# Patient Record
Sex: Female | Born: 1955 | Race: Black or African American | Hispanic: No | Marital: Married | Smoking: Never smoker
Health system: Southern US, Community
[De-identification: ages and names within clinical notes are randomized; demographics above are authoritative.]

## PROBLEM LIST (undated history)

## (undated) DIAGNOSIS — Z87442 Personal history of urinary calculi: Secondary | ICD-10-CM

## (undated) DIAGNOSIS — K5900 Constipation, unspecified: Secondary | ICD-10-CM

## (undated) DIAGNOSIS — R011 Cardiac murmur, unspecified: Secondary | ICD-10-CM

## (undated) DIAGNOSIS — J45909 Unspecified asthma, uncomplicated: Secondary | ICD-10-CM

## (undated) DIAGNOSIS — F329 Major depressive disorder, single episode, unspecified: Secondary | ICD-10-CM

## (undated) DIAGNOSIS — R519 Headache, unspecified: Secondary | ICD-10-CM

## (undated) DIAGNOSIS — K219 Gastro-esophageal reflux disease without esophagitis: Secondary | ICD-10-CM

## (undated) DIAGNOSIS — K589 Irritable bowel syndrome without diarrhea: Secondary | ICD-10-CM

## (undated) DIAGNOSIS — M1990 Unspecified osteoarthritis, unspecified site: Secondary | ICD-10-CM

## (undated) DIAGNOSIS — R7303 Prediabetes: Secondary | ICD-10-CM

## (undated) DIAGNOSIS — G4730 Sleep apnea, unspecified: Secondary | ICD-10-CM

## (undated) DIAGNOSIS — F32A Depression, unspecified: Secondary | ICD-10-CM

## (undated) DIAGNOSIS — Z8719 Personal history of other diseases of the digestive system: Secondary | ICD-10-CM

## (undated) DIAGNOSIS — D649 Anemia, unspecified: Secondary | ICD-10-CM

## (undated) DIAGNOSIS — I10 Essential (primary) hypertension: Secondary | ICD-10-CM

## (undated) DIAGNOSIS — G935 Compression of brain: Secondary | ICD-10-CM

## (undated) DIAGNOSIS — J189 Pneumonia, unspecified organism: Secondary | ICD-10-CM

## (undated) DIAGNOSIS — R32 Unspecified urinary incontinence: Secondary | ICD-10-CM

## (undated) DIAGNOSIS — R51 Headache: Secondary | ICD-10-CM

## (undated) DIAGNOSIS — G5600 Carpal tunnel syndrome, unspecified upper limb: Secondary | ICD-10-CM

## (undated) DIAGNOSIS — I639 Cerebral infarction, unspecified: Secondary | ICD-10-CM

## (undated) DIAGNOSIS — N189 Chronic kidney disease, unspecified: Secondary | ICD-10-CM

## (undated) DIAGNOSIS — C801 Malignant (primary) neoplasm, unspecified: Secondary | ICD-10-CM

## (undated) DIAGNOSIS — F419 Anxiety disorder, unspecified: Secondary | ICD-10-CM

## (undated) HISTORY — PX: BREAST SURGERY: SHX581

## (undated) HISTORY — DX: Sleep apnea, unspecified: G47.30

## (undated) HISTORY — PX: APPENDECTOMY: SHX54

## (undated) HISTORY — PX: ABDOMINAL HYSTERECTOMY: SHX81

## (undated) HISTORY — PX: CARPAL TUNNEL RELEASE: SHX101

## (undated) NOTE — *Deleted (*Deleted)
[facility]  [address] [place],  [place]  [address] [phone]  Clinic Day:  05/18/2020  Referring physician: No ref. provider found   This document serves as a record of services personally performed by Gery Pray, MD. It was created on their behalf by Curry,Lauren E, a trained medical scribe. The creation of this record is based on the scribe's personal observations and the provider's statements to them.   CHIEF COMPLAINT:  CC: ***  Current Treatment:  ***   HISTORY OF PRESENT ILLNESS:  April Pham is a 47 y.o. female with anemia, elevated alkaline phosphatase, history of a left thyroid nodule and significant weight loss.  Labs from February 3rd revealed an elevated alkaline phosphatase of the bone at 155. Whole body bone scan from April 20th was negative for malignancy, but showed significant arthritis.  Alkaline phosphatase of the liver was normal.  She presented to the Emergency Department in late March due to a fall where she hit her head and left side.  CT imaging of head, face, and cervical spine was pursued which was negative for head or facial injury.  However, it revealed a 2.4 cm hypodense left thyroid nodule, which appears slightly larger when compared to previous imaging results.  She underwent an ultrasound followed by a biopsy of this nodule on April 27th which confirmed this to be benign, consistent with a follicular nodule.   She also has a history of stage II right breast cancer in 2013, treated with lumpectomy and radiation.  She was placed on hormonal therapy with tamoxifen which was changed to letrozole after she had a mild stroke.  She stopped letrozole in December 2020, and in total she was on hormonal therapy for 7 years.  Annual mammogram from February 2021 was clear.  She reports intermittent severe fatigue to the point where she sometimes cannot even get out of bed and has difficulty keeping up with her daily  activities.  She continues to work, but has had to cut back on her hours.  Since her fall, she has been experiencing severe headaches and shooting pains of her legs.  She also sprained her left wrist at that time, and so she is wearing a brace.  She has chronic nausea.  She has intermittent hot and cold flashes, but since discontinuing letrozole these have improved.  She has night sweats.  She denies any forms of overt bleeding.  She had constant diarrhea partly due to Linzess.  She is scheduled for GI follow up on May 21st.  She undergoes esophageal dilation every year.  Her blood counts and chemistries are all unremarkable.  Her appetite is good, and she is eating very well.  However, she has lost over 40 pounds over the last 6 months.  She denies fever or chills.  She denies vomiting, bowel issues, or abdominal pain.  She denies sore throat, cough, dyspnea, or chest pain.  She started menarche at age 87.  She has had 2 pregnancies with 1 live birth and 1 abortion.  She had her 1st child at age 38.  She was on birth control previously to regulate her menstrual cycles.  She underwent a total hysterectomy and bilateral salpingo oophorectomy at age 3 due to fibroids and continued menses.      INTERVAL HISTORY:  April Pham is here for routine follow up   Her  appetite is good, and she has gained/lost _ pounds since her last visit.  She denies fever, chills  or other signs of infection.  She denies nausea, vomiting, bowel issues, or abdominal pain.  She denies sore throat, cough, dyspnea, or chest pain.   REVIEW OF SYSTEMS:  Review of Systems - Oncology   VITALS:  There were no vitals taken for this visit.  Wt Readings from Last 3 Encounters:  09/19/18 209 lb 11.2 oz (95.1 kg)  07/14/16 208 lb 9.6 oz (94.6 kg)  05/24/16 194 lb 10.7 oz (88.3 kg)    There is no height or weight on file to calculate BMI.  Performance status (ECOG): {CHL ONC [id number]  PHYSICAL EXAM:  Physical Exam  LABS:   CBC  Latest Ref Rng & Units 09/19/2018 07/13/2016 06/12/2016  WBC 4.0 - 10.5 K/uL 5.6 3.7(L) 4.6  Hemoglobin 12.0 - 15.0 g/dL 16.1 0.9(U) 11.6(L)  Hematocrit 36 - 46 % 40.2 29.6(L) 35.3  Platelets 150 - 400 K/uL 261 PLATELET CLUMPS NOTED ON SMEAR, COUNT APPEARS DECREASED 204   CMP Latest Ref Rng & Units 09/19/2018 07/13/2016 06/13/2016  Glucose 70 - 99 mg/dL 92 86 97  BUN 8 - 23 mg/dL 9 14 04(V)  Creatinine 0.44 - 1.00 mg/dL 4.09(W) 1.19(J) 4.78(G)  Sodium 135 - 145 mmol/L 140 137 138  Potassium 3.5 - 5.1 mmol/L 3.9 4.0 4.5  Chloride 98 - 111 mmol/L 110 105 106  CO2 22 - 32 mmol/L 22 21(L) 25  Calcium 8.9 - 10.3 mg/dL 9.8 9.8 9.2  Total Protein 6.5 - 8.1 g/dL - - -  Total Bilirubin 0.3 - 1.2 mg/dL - - -  Alkaline Phos 38 - 126 U/L - - -  AST 15 - 41 U/L - - -  ALT 14 - 54 U/L - - -     No results found for: CEA1 / No results found for: CEA1 No results found for: PSA1 No results found for: NFA213 No results found for: YQM578  No results found for: TOTALPROTELP, ALBUMINELP, A1GS, A2GS, BETS, BETA2SER, GAMS, MSPIKE, SPEI No results found for: TIBC, FERRITIN, IRONPCTSAT No results found for: LDH   STUDIES:  No results found.   Allergies:  Allergies  Allergen Reactions  . Ibuprofen Other (See Comments)    REFLUX  . Nsaids Other (See Comments)    REFLUX [IBUPROFEN]    Current Medications: Current Outpatient Medications  Medication Sig Dispense Refill  . acetaminophen (TYLENOL) 500 MG tablet Take 1,000 mg by mouth every 6 (six) hours as needed for headache.    Marland Kitchen amitriptyline (ELAVIL) 50 MG tablet Take 1 tablet (50 mg total) by mouth at bedtime. 30 tablet 0  . atorvastatin (LIPITOR) 10 MG tablet Take 1 tablet (10 mg total) by mouth daily at 6 PM. (Patient not taking: Reported on 09/16/2018) 30 tablet 0  . atorvastatin (LIPITOR) 20 MG tablet Take 20 mg by mouth at bedtime.    . B Complex-C-Folic Acid (SUPER B COMPLEX/FA/VIT C PO) Take 1 tablet by mouth daily.    . Calcium  Carb-Cholecalciferol (CALCIUM 600/VITAMIN D3 PO) Take 2 tablets by mouth daily.    . cetirizine (ZYRTEC) 10 MG tablet Take 10 mg by mouth daily.     . ferrous sulfate 325 (65 FE) MG EC tablet Take 325 mg by mouth daily.     . fluticasone (FLOVENT HFA) 110 MCG/ACT inhaler Inhale 1 puff into the lungs 2 (two) times daily.    Marland Kitchen HYDROcodone-acetaminophen (NORCO/VICODIN) 5-325 MG tablet Take 1 tablet by mouth every 4 (four) hours as needed for moderate pain. (Patient not taking: Reported  on 09/16/2018) 30 tablet 0  . letrozole (FEMARA) 2.5 MG tablet Take 2.5 mg by mouth daily.     Marland Kitchen linaclotide (LINZESS) 72 MCG capsule Take 72 mcg by mouth daily before breakfast.    . lisinopril-hydrochlorothiazide (PRINZIDE,ZESTORETIC) 10-12.5 MG tablet Take 1 tablet by mouth daily.  1  . omeprazole (PRILOSEC) 20 MG capsule Take 40 mg by mouth daily.     . ondansetron (ZOFRAN ODT) 4 MG disintegrating tablet Take 1 tablet (4 mg total) by mouth every 8 (eight) hours as needed for nausea or vomiting. 20 tablet 0  . QUEtiapine (SEROQUEL) 100 MG tablet Take 100 mg by mouth at bedtime.   5  . venlafaxine XR (EFFEXOR-XR) 75 MG 24 hr capsule Take 75 mg by mouth daily with breakfast.     No current facility-administered medications for this visit.     ASSESSMENT & PLAN:   Assessment:  April Pham is a 64 y.o. female *** 1.  History of breast cancer of the right breast, treated with lumpectomy and radiation therapy.  She was then placed on hormonal therapy with tamoxifen, but this was later changed to letrozole following a TIA.  She discontinued letrozole back in December 2020, after completing a total of 7 years of hormonal therapy.  She remains without evidence of recurrence.  2.  Left thyroid nodule now measuring 2.4 cm, which appears slightly larger when compared to previous imaging results.  Biopsy has confirmed this to be benign, consistent with follicular nodule.  3.  Significant weight loss within the  past 6 months despite regular meals and good appetite.  She has lost over 40 pounds within the last 6 months.  We will pursue CT imaging for further evaluation, especially in light of her upper abdominal pain and nausea.    4.  Elevated alkaline phosphatase.  Bone scan was negative, and the alkaline phosphatase is now normal and so I do not think further evaluation is needed.  5.  Anemia.  Apparently it has been borderline and up and down, but is normal today.  She is normochromic and normocytic.  I don't feel there is any pathology to pursue.  Plan: She is not anemic at this time, and her alkaline phosphatase is in a normal range.  Whole body bone scan was negative for malignancy, but did reveal significant arthritis.  I advised that we pursue CT imaging of chest, abdomen and pelvis as she has had a significant weight loss despite regular eating.  She also has upper abdominal pain and tenderness as well as nausea and a history of cancer.  Also, as she was on hormonal therapy for 7 years, I do recommend that she undergo a bone density scan.  I will plan to schedule these for her and call her with the results.  She is no longer seeing her previous oncologist, and so I will take over her oncology follow up care.  If all is well, I will see her back in 6 months for reexamination.  She and her husband understand and agree to this plan of care.  I have answered her questions and she knows to call with any concerns.   I provided *** minutes (9:28 AM - 9:28 AM) of face-to-face time during this this encounter and > 50% was spent counseling as documented under my assessment and plan.    Dellia Beckwith, MD [facility] [address] [place] [address] Dept: [phone]  Dept Fax: [phone]   I, Foye Deer, am acting as scribe for Dellia Beckwith, MD  I have reviewed this report as typed by the medical scribe, and it is  complete and accurate.

---

## 2016-05-24 ENCOUNTER — Inpatient Hospital Stay (HOSPITAL_COMMUNITY): Payer: Medicaid Other

## 2016-05-24 ENCOUNTER — Encounter (HOSPITAL_COMMUNITY): Payer: Self-pay | Admitting: *Deleted

## 2016-05-24 ENCOUNTER — Inpatient Hospital Stay (HOSPITAL_COMMUNITY)
Admission: EM | Admit: 2016-05-24 | Discharge: 2016-05-25 | DRG: 103 | Disposition: A | Discharge status: Home / Self Care | Payer: Medicaid Other | Source: Other Acute Inpatient Hospital | Attending: Internal Medicine | Admitting: Internal Medicine

## 2016-05-24 DIAGNOSIS — Z8249 Family history of ischemic heart disease and other diseases of the circulatory system: Secondary | ICD-10-CM

## 2016-05-24 DIAGNOSIS — F329 Major depressive disorder, single episode, unspecified: Secondary | ICD-10-CM | POA: Diagnosis present

## 2016-05-24 DIAGNOSIS — K219 Gastro-esophageal reflux disease without esophagitis: Secondary | ICD-10-CM | POA: Diagnosis present

## 2016-05-24 DIAGNOSIS — I129 Hypertensive chronic kidney disease with stage 1 through stage 4 chronic kidney disease, or unspecified chronic kidney disease: Secondary | ICD-10-CM | POA: Diagnosis present

## 2016-05-24 DIAGNOSIS — R040 Epistaxis: Secondary | ICD-10-CM | POA: Diagnosis present

## 2016-05-24 DIAGNOSIS — G44209 Tension-type headache, unspecified, not intractable: Secondary | ICD-10-CM | POA: Diagnosis present

## 2016-05-24 DIAGNOSIS — Q0700 Arnold-Chiari syndrome without spina bifida or hydrocephalus: Secondary | ICD-10-CM | POA: Diagnosis not present

## 2016-05-24 DIAGNOSIS — N183 Chronic kidney disease, stage 3 (moderate): Secondary | ICD-10-CM | POA: Diagnosis present

## 2016-05-24 DIAGNOSIS — R51 Headache: Secondary | ICD-10-CM

## 2016-05-24 DIAGNOSIS — Z8673 Personal history of transient ischemic attack (TIA), and cerebral infarction without residual deficits: Secondary | ICD-10-CM

## 2016-05-24 DIAGNOSIS — Z79811 Long term (current) use of aromatase inhibitors: Secondary | ICD-10-CM

## 2016-05-24 DIAGNOSIS — Z853 Personal history of malignant neoplasm of breast: Secondary | ICD-10-CM

## 2016-05-24 DIAGNOSIS — I10 Essential (primary) hypertension: Secondary | ICD-10-CM | POA: Diagnosis not present

## 2016-05-24 DIAGNOSIS — Z79899 Other long term (current) drug therapy: Secondary | ICD-10-CM

## 2016-05-24 DIAGNOSIS — R519 Headache, unspecified: Secondary | ICD-10-CM

## 2016-05-24 DIAGNOSIS — D649 Anemia, unspecified: Secondary | ICD-10-CM | POA: Diagnosis present

## 2016-05-24 HISTORY — DX: Essential (primary) hypertension: I10

## 2016-05-24 HISTORY — DX: Depression, unspecified: F32.A

## 2016-05-24 HISTORY — DX: Malignant (primary) neoplasm, unspecified: C80.1

## 2016-05-24 HISTORY — DX: Major depressive disorder, single episode, unspecified: F32.9

## 2016-05-24 LAB — CBC WITH DIFFERENTIAL/PLATELET
BASOS ABS: 0 10*3/uL (ref 0.0–0.1)
BASOS PCT: 0 %
Eosinophils Absolute: 0.3 10*3/uL (ref 0.0–0.7)
Eosinophils Relative: 6 %
HEMATOCRIT: 33.2 % — AB (ref 36.0–46.0)
HEMOGLOBIN: 10.5 g/dL — AB (ref 12.0–15.0)
Lymphocytes Relative: 53 %
Lymphs Abs: 2.3 10*3/uL (ref 0.7–4.0)
MCH: 26.7 pg (ref 26.0–34.0)
MCHC: 31.6 g/dL (ref 30.0–36.0)
MCV: 84.5 fL (ref 78.0–100.0)
Monocytes Absolute: 0.4 10*3/uL (ref 0.1–1.0)
Monocytes Relative: 9 %
NEUTROS ABS: 1.4 10*3/uL — AB (ref 1.7–7.7)
NEUTROS PCT: 32 %
Platelets: 214 10*3/uL (ref 150–400)
RBC: 3.93 MIL/uL (ref 3.87–5.11)
RDW: 13.1 % (ref 11.5–15.5)
WBC: 4.3 10*3/uL (ref 4.0–10.5)

## 2016-05-24 LAB — COMPREHENSIVE METABOLIC PANEL
ALK PHOS: 124 U/L (ref 38–126)
ALT: 15 U/L (ref 14–54)
ANION GAP: 8 (ref 5–15)
AST: 18 U/L (ref 15–41)
Albumin: 3.4 g/dL — ABNORMAL LOW (ref 3.5–5.0)
BILIRUBIN TOTAL: 0.4 mg/dL (ref 0.3–1.2)
BUN: 12 mg/dL (ref 6–20)
CALCIUM: 9.8 mg/dL (ref 8.9–10.3)
CO2: 27 mmol/L (ref 22–32)
Chloride: 104 mmol/L (ref 101–111)
Creatinine, Ser: 1.25 mg/dL — ABNORMAL HIGH (ref 0.44–1.00)
GFR calc non Af Amer: 45 mL/min — ABNORMAL LOW (ref >60)
GFR, EST AFRICAN AMERICAN: 53 mL/min — AB (ref >60)
Glucose, Bld: 95 mg/dL (ref 65–99)
Potassium: 3.9 mmol/L (ref 3.5–5.1)
Sodium: 139 mmol/L (ref 135–145)
TOTAL PROTEIN: 6.4 g/dL — AB (ref 6.5–8.1)

## 2016-05-24 LAB — SEDIMENTATION RATE: Sed Rate: 25 mm/hr — ABNORMAL HIGH (ref 0–22)

## 2016-05-24 LAB — RAPID URINE DRUG SCREEN, HOSP PERFORMED
Amphetamines: NOT DETECTED
BARBITURATES: NOT DETECTED
BENZODIAZEPINES: NOT DETECTED
COCAINE: NOT DETECTED
Opiates: POSITIVE — AB
TETRAHYDROCANNABINOL: NOT DETECTED

## 2016-05-24 MED ORDER — ENSURE ENLIVE PO LIQD
237.0000 mL | ORAL | Status: DC
  Administered 2016-05-24: 237 mL via ORAL

## 2016-05-24 MED ORDER — HYDRALAZINE HCL 20 MG/ML IJ SOLN: 10.0000 mg | INTRAMUSCULAR | Status: DC | PRN

## 2016-05-24 MED ORDER — LISINOPRIL 10 MG PO TABS
10.0000 mg | ORAL_TABLET | Freq: Every day | ORAL | Status: DC
  Administered 2016-05-24: 10 mg via ORAL
  Filled 2016-05-24 (×2): qty 1

## 2016-05-24 MED ORDER — LUBIPROSTONE 24 MCG PO CAPS
24.0000 ug | ORAL_CAPSULE | Freq: Two times a day (BID) | ORAL | Status: DC
Start: 2016-05-24 — End: 2016-05-25
  Administered 2016-05-24 – 2016-05-25 (×2): 24 ug via ORAL
  Filled 2016-05-24 (×2): qty 1

## 2016-05-24 MED ORDER — MORPHINE SULFATE (PF) 2 MG/ML IV SOLN
2.0000 mg | INTRAVENOUS | Status: DC | PRN
  Administered 2016-05-24 – 2016-05-25 (×6): 2 mg via INTRAVENOUS
  Filled 2016-05-24 (×7): qty 1

## 2016-05-24 MED ORDER — ONDANSETRON HCL 4 MG/2ML IJ SOLN: 4.0000 mg | Freq: Four times a day (QID) | INTRAMUSCULAR | Status: DC | PRN

## 2016-05-24 MED ORDER — LETROZOLE 2.5 MG PO TABS
2.5000 mg | ORAL_TABLET | Freq: Every day | ORAL | Status: DC
  Administered 2016-05-25: 2.5 mg via ORAL
  Filled 2016-05-24 (×2): qty 1

## 2016-05-24 MED ORDER — FERROUS SULFATE 325 (65 FE) MG PO TABS
325.0000 mg | ORAL_TABLET | Freq: Every day | ORAL | Status: DC
  Administered 2016-05-24 – 2016-05-25 (×2): 325 mg via ORAL
  Filled 2016-05-24 (×2): qty 1

## 2016-05-24 MED ORDER — ACETAMINOPHEN 325 MG PO TABS
650.0000 mg | ORAL_TABLET | Freq: Four times a day (QID) | ORAL | Status: DC | PRN
  Administered 2016-05-24: 650 mg via ORAL
  Filled 2016-05-24: qty 2

## 2016-05-24 MED ORDER — SODIUM CHLORIDE 0.9 % IV SOLN
INTRAVENOUS | Status: AC
  Administered 2016-05-24 (×2): via INTRAVENOUS

## 2016-05-24 MED ORDER — ONDANSETRON HCL 4 MG PO TABS: 4.0000 mg | ORAL_TABLET | Freq: Four times a day (QID) | ORAL | Status: DC | PRN

## 2016-05-24 MED ORDER — ACETAMINOPHEN 650 MG RE SUPP: 650.0000 mg | Freq: Four times a day (QID) | RECTAL | Status: DC | PRN

## 2016-05-24 MED ORDER — QUETIAPINE FUMARATE 25 MG PO TABS
200.0000 mg | ORAL_TABLET | Freq: Every day | ORAL | Status: DC
  Administered 2016-05-24: 200 mg via ORAL
  Filled 2016-05-24: qty 8

## 2016-05-24 MED ORDER — FLUOXETINE HCL 20 MG PO CAPS
40.0000 mg | ORAL_CAPSULE | Freq: Every day | ORAL | Status: DC
  Administered 2016-05-24 – 2016-05-25 (×2): 40 mg via ORAL
  Filled 2016-05-24 (×2): qty 2

## 2016-05-24 NOTE — Progress Notes (Signed)
This is a no charge note  Transfer from [facility] per Dr. Maxwell Caul  60 year old lady with past medical history of hypertension, stroke, GERD, depression, iron deficiency anemia, who presents with headache, blurry vision and right arm tingling, which has been going on for 2 days. CT head showed Chiari-I malformation. Pt's CT-scan of head on 12/2013 did no have show this finding per EDP. Neurology, dr. Cheral Marker was consulted, who recommended neurosurgeons consultation and evaluation.   WBC 4.7, creatinine 1.4 which was 1.0 on 03/2016 and 2.8 on 12/2015, temperature normal, blood pressure 146/71, O2 saturation 98% on room air. Patient is accepted to tele bed as inpatient. Please consult to neurosurgeon at patient arrival. Pt may also need MRI of brain.   Ivor Costa, MD  [facility] Pager [phone]  If 7PM-7AM, please contact night-coverage [email or web address] Password [email or web address] 05/24/2016, 2:42 AM

## 2016-05-24 NOTE — H&P (Addendum)
History and Physical    April Pham [id number] DOB: 1956/01/27 DOA: 05/24/2016  PCP: No PCP Per Patient  Patient coming from: [facility].  Chief Complaint: Headache.  HPI: April Pham is a 60 y.o. female with history of hypertension, breast cancer in remission, depression and iron deficiency anemia presents to the ER at [facility] with severe headache. Patient states she has been having headaches which starts from the occipital area and goes across the front of the head with some nausea and blurred vision over the last 2 weeks which has been progressively worsening. Had some tingling of the right upper extremity. Denies any weakness of the upper or lower extremity. Patient also had some epistaxis. 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 Patient was given morphine for the headache. On call neurologist at [facility] was consulted and transferred to [facility] for further management and possible need for neurosurgery consult.   ED Course: Morphine was given in the ER at [facility] for pain relief. Labs done at [facility] showed WBC of 4.7 hemoglobin of 11 platelets of 212 sodium of 138 potassium 4 creatinine 1.4  Review of Systems: As per HPI, rest all negative.   Past Medical History:  Diagnosis Date  . Cancer ([place])   . Depression   . Hypertension     Past Surgical History:  Procedure Laterality Date  . ABDOMINAL HYSTERECTOMY    . APPENDECTOMY    . BREAST SURGERY       reports that she has never smoked. She has never used smokeless tobacco. She reports that she does not drink alcohol or use drugs.  Not on File  Family History  Problem Relation Age of Onset  . Hypertension Daughter     Prior to Admission medications   Not on File    Physical Exam: Vitals:   05/24/16 0442  BP: 125/69  Pulse: 66  Resp: 18  Temp: 99 F (37.2 C)  TempSrc: Oral  SpO2: 97%  Weight: 88.3 kg (194 lb 10.7 oz)    Height: 5\' 6"  (1.676 m)      Constitutional: Moderately built and nourished. Vitals:   05/24/16 0442  BP: 125/69  Pulse: 66  Resp: 18  Temp: 99 F (37.2 C)  TempSrc: Oral  SpO2: 97%  Weight: 88.3 kg (194 lb 10.7 oz)  Height: 5\' 6"  (1.676 m)   Eyes: Anicteric no pallor. ENMT: No discharge from the ears eyes nose or mouth. Neck: No mass felt. No neck rigidity. Respiratory: No rhonchi or crepitations. Cardiovascular: S1 and S2 heard. No murmurs appreciated. Abdomen: Soft nontender bowel sounds present. No guarding or rigidity. Musculoskeletal: No edema. No joint effusion. Skin: No rash. Skin appears warm. Neurologic: Alert awake oriented to time place and person. Moves all extremities 5 x 5. No facial asymmetry. Tongue is midline. Psychiatric: Appears normal. Normal affect.   Labs on Admission: I have personally reviewed following labs and imaging studies  CBC: No results for input(s): WBC, NEUTROABS, HGB, HCT, MCV, PLT in the last 168 hours. Basic Metabolic Panel: No results for input(s): NA, K, CL, CO2, GLUCOSE, BUN, CREATININE, CALCIUM, MG, PHOS in the last 168 hours. GFR: CrCl cannot be calculated (No order found.). Liver Function Tests: No results for input(s): AST, ALT, ALKPHOS, BILITOT, PROT, ALBUMIN in the last 168 hours. No results for input(s): LIPASE, AMYLASE in the last 168 hours. No results for input(s): AMMONIA in the last 168 hours. Coagulation Profile: No results  for input(s): INR, PROTIME in the last 168 hours. Cardiac Enzymes: No results for input(s): CKTOTAL, CKMB, CKMBINDEX, TROPONINI in the last 168 hours. BNP (last 3 results) No results for input(s): PROBNP in the last 8760 hours. HbA1C: No results for input(s): HGBA1C in the last 72 hours. CBG: No results for input(s): GLUCAP in the last 168 hours. Lipid Profile: No results for input(s): CHOL, HDL, LDLCALC, TRIG, CHOLHDL, LDLDIRECT in the last 72 hours. Thyroid Function Tests: No results  for input(s): TSH, T4TOTAL, FREET4, T3FREE, THYROIDAB in the last 72 hours. Anemia Panel: No results for input(s): VITAMINB12, FOLATE, FERRITIN, TIBC, IRON, RETICCTPCT in the last 72 hours. Urine analysis: No results found for: COLORURINE, APPEARANCEUR, LABSPEC, PHURINE, GLUCOSEU, HGBUR, BILIRUBINUR, KETONESUR, PROTEINUR, UROBILINOGEN, NITRITE, LEUKOCYTESUR Sepsis Labs: @LABRCNTIP (procalcitonin:4,lacticidven:4) )No results found for this or any previous visit (from the past 240 hour(s)).   Radiological Exams on Admission: No results found.   Assessment/Plan Principal Problem:   Headache Active Problems:   Essential hypertension   Normochromic normocytic anemia   History of breast cancer    1. Headache with CT scan showing Arnold-Chiari malformation type I - Patient has been placed on when necessary morphine for pain relief. I have discussed with on-call neurosurgeon Dr. Ronnald Ramp who has advised MRI brain and to discuss with Dr. Ronnald Ramp, neurosurgeon after the MRI brain results are available. 2. Hypertension - we'll continue lisinopril and hold hydrochlorothiazide since patient is getting gentle hydration. Follow metabolic panel. When necessary IV hydralazine for systolic blood pressure more than 160. 3. Renal failure Baseline creatinine not known probably chronic - repeat metabolic panel has been ordered. Creatinine at [facility] is 1.4. If there is further worsening of creatinine and hold lisinopril. 4. Normocytic normochromic anemia on iron replacement - follow CBC. 5. History of breast cancer in remission on Femara. 6. Depression on sertraline and fluoxetine.  Home medication reconciliation is still pending. Repeat labs including CBC metabolic panel and sedimentation rate MRI brain pending.   DVT prophylaxis: SCDs. Code Status: Full code.  Family Communication: Discussed with patient.  Disposition Plan: Home.  Consults called: Neurosurgery Dr. Ronnald Ramp. Admission status:  Inpatient. Likely stay 2 days.    Rise Patience MD [facility] Pager [phone].  If 7PM-7AM, please contact night-coverage [email or web address] Password [facility]  05/24/2016, 8:39 AM

## 2016-05-24 NOTE — Progress Notes (Addendum)
MD, Hal Hope, called to check on patient. Told MD patients vitals and pain 10/10. Gave verbal orders to administer 2 mg morphine 4 mg IV PRN Q4. Will continue to monitor

## 2016-05-24 NOTE — Progress Notes (Signed)
Initial Nutrition Assessment  DOCUMENTATION CODES:   Obesity unspecified  INTERVENTION:   - Provide Ensure Enlive oral nutrition supplement once daily. Each provides 350 kcal and 20 grams protein. - Encourage PO intake.  NUTRITION DIAGNOSIS:   Inadequate oral intake related to poor appetite as evidenced by per patient/family report.  GOAL:   Patient will meet greater than or equal to 90% of their needs  MONITOR:   PO intake, Supplement acceptance, Weight trends  REASON FOR ASSESSMENT:   Malnutrition Screening Tool   ASSESSMENT:   60 y.o. female with history of hypertension, breast cancer in remission, depression and iron deficiency anemia presents to the ER with severe headache. Pt states she has been having headaches which start from the occipital area and goes across the front of the head with some nausea and blurred vision over the last 2 weeks which has been progressively worsening. Had some tingling of the right upper extremity. Pt also had some epistaxis. 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  Spoke with pt at bedside who reports appetite is "off and on" and that she typically consumes 1-2 meals per day. Pt states she cooks at home and that a typical dinner might include cabbage, green beans, and ham. Pt eating lunch meal at time of visit; states she is "very hungry" as she did not eat at all yesterday.  Pt reports her UBW is 199# and that she last weighed this 1 month PTA.  Pt states she has consumed oral supplements in the past especially when she did not feeling like eating a meal. Pt would like to receive Ensure Enlive oral nutrition supplement during hospital stay. Will order one daily.  Medications reviewed and include 325 mg ferrous sulfate, PRN Zofran  Labs reviewed and include elevated creatinine (1.25 mg/dL)  NFPE: Exam completed. No fat depletion, no muscle depletion, and no edema noted.  Diet Order:  Diet Heart Room service  appropriate? Yes; Fluid consistency: Thin  Skin:  Reviewed, no issues  Last BM:  05/22/16  Height:   Ht Readings from Last 1 Encounters:  05/24/16 5\' 6"  (1.676 m)    Weight:   Wt Readings from Last 1 Encounters:  05/24/16 194 lb 10.7 oz (88.3 kg)    Ideal Body Weight:  59.1 kg  BMI:  Body mass index is 31.42 kg/m.  Estimated Nutritional Needs:   Kcal:  1600-1800 (18-20 kcal/kg)  Protein:  85-100 grams  Fluid:  1.6-1.8 L/day  EDUCATION NEEDS:   No education needs identified at this time  Jeb Levering Dietetic Intern Pager Number: [phone]

## 2016-05-24 NOTE — Progress Notes (Signed)
Patient is experiencing pain 10/10. Dr. Hal Hope made aware. No new orders at this time

## 2016-05-24 NOTE — Progress Notes (Signed)
PROGRESS NOTE    April Pham  [id number] DOB: 02-Sep-1955 DOA: 05/24/2016 PCP: No PCP Per Patient   No chief complaint on file.   Brief Narrative:  HPI on 05/24/2016 by Dr. Gean Birchwood April Pham is a 60 y.o. female with history of hypertension, breast cancer in remission, depression and iron deficiency anemia presents to the ER at [facility] with severe headache. Patient states she has been having headaches which starts from the occipital area and goes across the front of the head with some nausea and blurred vision over the last 2 weeks which has been progressively worsening. Had some tingling of the right upper extremity. Denies any weakness of the upper or lower extremity. Patient also had some epistaxis. 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 Patient was given morphine for the headache. On call neurologist at [facility] was consulted and transferred to [facility] for further management and possible need for neurosurgery consult.  Assessment & Plan   Headache -Unknown etiology -CT head done at [facility] showed Arnold-Chiari malformation type I. Patient was sent to [facility] for further workup -MRI brain: Negative for acute infarct, scattered small white matter hyper intensities, cerebellar tonsillar ectopia -Continue pain control -Spoke with Dr. Ronnald Ramp, did not feel Chiari malformation is the cause of patient's headache  Arnold Chiari malformation, Type I -MRI as noted above -Spoke with Dr. Ronnald Ramp, neurosurgery, who recommended outpatient follow-up  Essential hypertension -Continue lisinopril, HCTZ held  Chronic kidney disease, unknown stage (possibly stage III) -Creatinine currently 1.25. Was noted to be 1.4 at [facility] to monitor BMP  Normocytic normochromic anemia -Continue iron replacement -Hemoglobin currently 10.5, unknown baseline -Continue to monitor CBC  History of breast  cancer -Continue Femara  Depression -Continue sertraline and fluoxetine  DVT Prophylaxis  SCDs  Code Status: Full  Family Communication: None at bedside  Disposition Plan: Admitted  Consultants Neurosurgeon, Dr. Ronnald Ramp, Via phone  Procedures  None  Antibiotics   Anti-infectives    None      Subjective:   April Pham seen and examined today. Continues to have headache. Denies chest pain, shortness of breath, abdominal pain, nausea or vomiting, diarrhea or constipation.  Objective:   Vitals:   05/24/16 0442 05/24/16 0921  BP: 125/69 (!) 120/59  Pulse: 66 69  Resp: 18   Temp: 99 F (37.2 C)   TempSrc: Oral   SpO2: 97% 99%  Weight: 88.3 kg (194 lb 10.7 oz)   Height: 5\' 6"  (1.676 m)     Intake/Output Summary (Last 24 hours) at 05/24/16 1400 Last data filed at 05/24/16 1005  Gross per 24 hour  Intake               60 ml  Output              300 ml  Net             -240 ml   Filed Weights   05/24/16 0442  Weight: 88.3 kg (194 lb 10.7 oz)    Exam  General: Well developed, well nourished, NAD, appears stated age  HEENT: NCAT,  mucous membranes moist.   Cardiovascular: S1 S2 auscultated, no rubs, murmurs or gallops. Regular rate and rhythm.  Respiratory: Clear to auscultation bilaterally with equal chest rise  Abdomen: Soft, nontender, nondistended, + bowel sounds  Extremities: warm dry without cyanosis clubbing or edema  Neuro: AAOx3, nonfocal  Psych: Normal affect and demeanor with intact judgement  and insight   Data Reviewed: I have personally reviewed following labs and imaging studies  CBC:  Recent Labs Lab 05/24/16 0854  WBC 4.3  NEUTROABS 1.4*  HGB 10.5*  HCT 33.2*  MCV 84.5  PLT [id number]   Basic Metabolic Panel:  Recent Labs Lab 05/24/16 0854  NA 139  K 3.9  CL 104  CO2 27  GLUCOSE 95  BUN 12  CREATININE 1.25*  CALCIUM 9.8   GFR: Estimated Creatinine Clearance: 52.9 mL/min (by C-G formula based on SCr of 1.25 mg/dL  (H)). Liver Function Tests:  Recent Labs Lab 05/24/16 0854  AST 18  ALT 15  ALKPHOS 124  BILITOT 0.4  PROT 6.4*  ALBUMIN 3.4*   No results for input(s): LIPASE, AMYLASE in the last 168 hours. No results for input(s): AMMONIA in the last 168 hours. Coagulation Profile: No results for input(s): INR, PROTIME in the last 168 hours. Cardiac Enzymes: No results for input(s): CKTOTAL, CKMB, CKMBINDEX, TROPONINI in the last 168 hours. BNP (last 3 results) No results for input(s): PROBNP in the last 8760 hours. HbA1C: No results for input(s): HGBA1C in the last 72 hours. CBG: No results for input(s): GLUCAP in the last 168 hours. Lipid Profile: No results for input(s): CHOL, HDL, LDLCALC, TRIG, CHOLHDL, LDLDIRECT in the last 72 hours. Thyroid Function Tests: No results for input(s): TSH, T4TOTAL, FREET4, T3FREE, THYROIDAB in the last 72 hours. Anemia Panel: No results for input(s): VITAMINB12, FOLATE, FERRITIN, TIBC, IRON, RETICCTPCT in the last 72 hours. Urine analysis: No results found for: COLORURINE, APPEARANCEUR, LABSPEC, PHURINE, GLUCOSEU, HGBUR, BILIRUBINUR, KETONESUR, PROTEINUR, UROBILINOGEN, NITRITE, LEUKOCYTESUR Sepsis Labs: @LABRCNTIP (procalcitonin:4,lacticidven:4)  )No results found for this or any previous visit (from the past 240 hour(s)).    Radiology Studies: Mr Brain Wo Contrast  Result Date: 05/24/2016 CLINICAL DATA:  Headache EXAM: MRI HEAD WITHOUT CONTRAST TECHNIQUE: Multiplanar, multiecho pulse sequences of the brain and surrounding structures were obtained without intravenous contrast. COMPARISON:  None. FINDINGS: Brain: Ventricle size normal. Cerebral volume normal. Negative for acute infarct. Scattered small punctate white matter hyperintensities in the subcortical white matter bilaterally. Brainstem and cerebellum normal. Negative for hemorrhage or mass. Pituitary normal in size. Cerebellar tonsils are low-lying measuring approximately 8 mm below the foramen  magnum. Vascular: Normal arterial flow voids Skull and upper cervical spine: Negative Sinuses/Orbits: Mild mucosal edema paranasal sinuses.  Normal orbit Other: None IMPRESSION: Negative for acute infarct. Scattered small white matter hyperintensities may represent chronic microvascular disease or sequela of migraine headache Cerebellar tonsillar ectopia Electronically Signed   By: Franchot Gallo M.D.   On: 05/24/2016 11:40     Scheduled Meds: . ferrous sulfate  325 mg Oral Q breakfast  . FLUoxetine  40 mg Oral Daily  . lisinopril  10 mg Oral Daily  . QUEtiapine  200 mg Oral QHS   Continuous Infusions: . sodium chloride 75 mL/hr at 05/24/16 0917     LOS: 0 days   Time Spent in minutes   30 minutes  Cylinda Santoli D.O. on 05/24/2016 at 2:00 PM  Between 7am to 7pm - Pager - [phone]  After 7pm go to [email or web address] - password [email or web address]  And look for the night coverage person covering for me after hours  [facility] Office  [phone]

## 2016-05-24 NOTE — Progress Notes (Signed)
Patient/Family oriented to room. Information packet given to patient/family. Admission inpatient armband information verified with patient/family to include name and date of birth and placed on patient arm. Side rails up x 2, fall assessment and education completed with patient/family. Call light within reach. Patient/family able to voice and demonstrate understanding of unit orientation instructions  

## 2016-05-24 NOTE — Progress Notes (Signed)
Dr. Ree Kida paged to make aware patient arrived last shift and needs orders.

## 2016-05-25 DIAGNOSIS — Z853 Personal history of malignant neoplasm of breast: Secondary | ICD-10-CM

## 2016-05-25 DIAGNOSIS — D649 Anemia, unspecified: Secondary | ICD-10-CM

## 2016-05-25 DIAGNOSIS — I10 Essential (primary) hypertension: Secondary | ICD-10-CM

## 2016-05-25 LAB — BASIC METABOLIC PANEL
ANION GAP: 6 (ref 5–15)
BUN: 13 mg/dL (ref 6–20)
CALCIUM: 9.4 mg/dL (ref 8.9–10.3)
CHLORIDE: 108 mmol/L (ref 101–111)
CO2: 26 mmol/L (ref 22–32)
CREATININE: 1.04 mg/dL — AB (ref 0.44–1.00)
GFR calc non Af Amer: 57 mL/min — ABNORMAL LOW (ref >60)
Glucose, Bld: 103 mg/dL — ABNORMAL HIGH (ref 65–99)
Potassium: 3.9 mmol/L (ref 3.5–5.1)
SODIUM: 140 mmol/L (ref 135–145)

## 2016-05-25 LAB — CBC
HCT: 32.1 % — ABNORMAL LOW (ref 36.0–46.0)
HEMOGLOBIN: 10.2 g/dL — AB (ref 12.0–15.0)
MCH: 26.9 pg (ref 26.0–34.0)
MCHC: 31.8 g/dL (ref 30.0–36.0)
MCV: 84.7 fL (ref 78.0–100.0)
PLATELETS: 195 10*3/uL (ref 150–400)
RBC: 3.79 MIL/uL — AB (ref 3.87–5.11)
RDW: 12.9 % (ref 11.5–15.5)
WBC: 4.5 10*3/uL (ref 4.0–10.5)

## 2016-05-25 MED ORDER — BUTALBITAL-APAP-CAFFEINE 50-325-40 MG PO TABS
1.0000 | ORAL_TABLET | Freq: Four times a day (QID) | ORAL | Status: DC | PRN
  Administered 2016-05-25: 1 via ORAL
  Filled 2016-05-25: qty 1

## 2016-05-25 NOTE — Care Management Note (Signed)
Case Management Note  Patient Details  Name: April Pham MRN: [id number] Date of Birth: [birth year]  Subjective/Objective:   Pt with history of hypertension, breast cancer in remission, depression and iron deficiency anemia with persistent c/o of HA. From home with spouse. Independent with ADL's and no DME usage.        PCP: Katha Hamming NP Madlyn Frankel)  Action/Plan: Plan is d/c to home today with follow up with primary care provider within one week.   Expected Discharge Date:                  Expected Discharge Plan:  Home/Self Care  In-House Referral:     Discharge planning Services  CM Consult  Status of Service:  Completed, signed off  If discussed at [place] of Stay Meetings, dates discussed:    Additional Comments:  Sharin Mons, RN 05/25/2016, 12:15 PM

## 2016-05-25 NOTE — Progress Notes (Addendum)
CM received consult: PCP. CM spoke with pt and pt states Katha Hamming NP is her primary care provider in [place]. Whitman Hero RN,BSN,CM

## 2016-05-25 NOTE — Discharge Summary (Signed)
Physician Discharge Summary  April Pham [id number] DOB: [birth year] DOA: 05/24/2016  PCP: No PCP Per Patient  Admit date: 05/24/2016 Discharge date: 05/25/2016  Time spent: 45 minutes  Recommendations for Outpatient Follow-up:  Patient will be discharged to home.  Patient will need to follow up with primary care provider within one week of discharge, discuss headaches and blood pressure management (your blood pressure medications are currently discontinued as your blood pressure is low to normal).  Follow up with Dr. Ronnald Ramp, neurosurgeon. Patient should continue medications as prescribed.  Patient should follow a heart healthy diet.   Discharge Diagnoses:  Headache [name] Chiari malformation, Type I Essential hypertension Chronic kidney disease, unknown stage (possibly stage III) Normocytic normochromic anemia History of breast cancer Depression  Discharge Condition: Stable  Diet recommendation: heart healthy  Filed Weights   05/24/16 0442  Weight: 88.3 kg (194 lb 10.7 oz)    History of present illness:  on 05/24/2016 by Dr. [name] a 60 y.o.femalewith history of hypertension, breast cancer in remission, depression and iron deficiency anemia presents to the ER at [facility] with severe headache. Patient states she has been having headaches which starts from the occipital area and goes across the front of the head with some nausea and blurred vision over the last 2 weeks which has been progressively worsening. Had some tingling of the right upper extremity. Denies any weakness of the upper or lower extremity. Patient also had some epistaxis. 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 Patient was given morphine for the headache. On call neurologist at [facility] consulted and transferred to [facility] for further management and possible need for neurosurgery consult.  Hospital Course:   Headache -Unknown etiology. ?tension headache given story of band like sensation -[facility] head done at [facility] showed Arnold-Chiari malformation type I. Patient was sent to [facility] for further workup -MRI brain: Negative for acute infarct, scattered small white matter hyper intensities, cerebellar tonsillar ectopia -Continue pain control -Spoke with Dr. Ronnald Ramp, did not feel Chiari malformation is the cause of patient's headache -Patient should discuss this with her PCP and may need neurology referral.  Should try OTC medications, such as Tylenol. NSAIDS are recommended, however, given patient's AKI vs CKD, should be used with caution.  Arnold Chiari malformation, Type I -MRI as noted above -Spoke with Dr. Ronnald Ramp, neurosurgery, who recommended outpatient follow-up  Essential hypertension -Continue lisinopril, HCTZ held- would continue to hold as patient's BP low normal. -Should discuss restarting medications with PCP  Chronic kidney disease, unknown stage (possibly stage III) -Creatinine currently 1.04. Was noted to be 1.4 at [facility] -Monitor BMP as an outpatient with PCP  Normocytic normochromic anemia -Continue iron replacement -Hemoglobin stable and currently 10.2, unknown baseline  History of breast cancer -Continue Femara  Depression -Continue sertraline and fluoxetine  Consultants Neurosurgeon, Dr. Ronnald Ramp, Via phone  Procedures  None  Discharge Exam: Vitals:   05/24/16 2046 05/25/16 0536  BP: (!) 119/57 (!) 94/52  Pulse: 78 (!) 55  Resp: 18 17  Temp: 98.9 F (37.2 C) 97.7 F (36.5 C)   Continues to have headache. Denies chest pain, shortness of breath, abdominal pain, nausea or vomiting, diarrhea or constipation.  Exam  General: Well developed, well nourished, NAD  HEENT: NCAT,  mucous membranes moist. Poor dentition  Cardiovascular: S1 S2 auscultated, no murmurs, RRR  Respiratory: Clear to auscultation bilaterally with equal chest  rise  Abdomen: Soft, nontender, nondistended, + bowel  sounds  Extremities: warm dry without cyanosis clubbing or edema  Neuro: AAOx3, nonfocal  Psych: Appropriate (?drug seeking- patient rates headache as 10/10 and constant, but I had to awaken her from sleep)  Discharge Instructions Discharge Instructions    Discharge instructions    Complete by:  As directed    Patient will be discharged to home.  Patient will need to follow up with primary care provider within one week of discharge, discuss headaches and blood pressure management (your blood pressure medications are currently discontinued as your blood pressure is low to normal).  Follow up with Dr. Ronnald Ramp, neurosurgeon. Patient should continue medications as prescribed.  Patient should follow a heart healthy diet.     Current Discharge Medication List    CONTINUE these medications which have NOT CHANGED   Details  AMITIZA 24 MCG capsule Take 24 mcg by mouth 2 (two) times daily. Refills: 6    b complex vitamins tablet Take 1 tablet by mouth daily.    cetirizine (ZYRTEC) 10 MG tablet Take 10 mg by mouth daily.    ergocalciferol (VITAMIN D2) 50000 units capsule Take 50,000 Units by mouth once a week.    ferrous sulfate 325 (65 FE) MG EC tablet Take 325 mg by mouth daily.    FLUoxetine (PROZAC) 40 MG capsule Take 40 mg by mouth daily.    hydrOXYzine (ATARAX/VISTARIL) 25 MG tablet Take 25 mg by mouth 3 (three) times daily as needed for itching.    letrozole (FEMARA) 2.5 MG tablet Take 2.5 mg by mouth daily.    Multiple Vitamins-Minerals (MULTIVITAMIN ADULTS 50+ PO) Take 1 tablet by mouth daily.    omeprazole (PRILOSEC) 20 MG capsule Take 20 mg by mouth 2 (two) times daily before a meal.    oxyCODONE (OXY IR/ROXICODONE) 5 MG immediate release tablet Take 5 mg by mouth every 6 (six) hours as needed for moderate pain.  Refills: 0    QUEtiapine (SEROQUEL) 100 MG tablet Take 200 mg by mouth at bedtime. Refills: 5    vitamin C  (ASCORBIC ACID) 500 MG tablet Take 500 mg by mouth daily.      STOP taking these medications     lisinopril-hydrochlorothiazide (PRINZIDE,ZESTORETIC) 10-12.5 MG tablet        No Known Allergies Follow-up Information    Primary care physician. Schedule an appointment as soon as possible for a visit in 1 week(s).   Why:  Hospital follow up       JONES,DAVID S, MD. Schedule an appointment as soon as possible for a visit in 1 week(s).   Specialty:  Neurosurgery Why:  Hospital followu p Contact information: [address] [place] [address] [phone]            The results of significant diagnostics from this hospitalization (including imaging, microbiology, ancillary and laboratory) are listed below for reference.    Significant Diagnostic Studies: Mr Brain Wo Contrast  Result Date: 05/24/2016 CLINICAL DATA:  Headache EXAM: MRI HEAD WITHOUT CONTRAST TECHNIQUE: Multiplanar, multiecho pulse sequences of the brain and surrounding structures were obtained without intravenous contrast. COMPARISON:  None. FINDINGS: Brain: Ventricle size normal. Cerebral volume normal. Negative for acute infarct. Scattered small punctate white matter hyperintensities in the subcortical white matter bilaterally. Brainstem and cerebellum normal. Negative for hemorrhage or mass. Pituitary normal in size. Cerebellar tonsils are low-lying measuring approximately 8 mm below the foramen magnum. Vascular: Normal arterial flow voids Skull and upper cervical spine: Negative Sinuses/Orbits: Mild mucosal edema paranasal  sinuses.  Normal orbit Other: None IMPRESSION: Negative for acute infarct. Scattered small white matter hyperintensities may represent chronic microvascular disease or sequela of migraine headache Cerebellar tonsillar ectopia Electronically Signed   By: Franchot Gallo M.D.   On: 05/24/2016 11:40    Microbiology: No results found for this or any previous visit (from the past 240  hour(s)).   Labs: Basic Metabolic Panel:  Recent Labs Lab 05/24/16 0854 05/25/16 0600  NA 139 140  K 3.9 3.9  CL 104 108  CO2 27 26  GLUCOSE 95 103*  BUN 12 13  CREATININE 1.25* 1.04*  CALCIUM 9.8 9.4   Liver Function Tests:  Recent Labs Lab 05/24/16 0854  AST 18  ALT 15  ALKPHOS 124  BILITOT 0.4  PROT 6.4*  ALBUMIN 3.4*   No results for input(s): LIPASE, AMYLASE in the last 168 hours. No results for input(s): AMMONIA in the last 168 hours. CBC:  Recent Labs Lab 05/24/16 0854 05/25/16 0600  WBC 4.3 4.5  NEUTROABS 1.4*  --   HGB 10.5* 10.2*  HCT 33.2* 32.1*  MCV 84.5 84.7  PLT 214 195   Cardiac Enzymes: No results for input(s): CKTOTAL, CKMB, CKMBINDEX, TROPONINI in the last 168 hours. BNP: BNP (last 3 results) No results for input(s): BNP in the last 8760 hours.  ProBNP (last 3 results) No results for input(s): PROBNP in the last 8760 hours.  CBG: No results for input(s): GLUCAP in the last 168 hours.     SignedCristal Ford  [facility] 05/25/2016, 10:30 AM

## 2016-05-25 NOTE — Discharge Instructions (Signed)
General Headache Without Cause Introduction A headache is pain or discomfort felt around the head or neck area. There are many causes and types of headaches. In some cases, the cause may not be found. Follow these instructions at home: Managing pain  Take over-the-counter and prescription medicines only as told by your doctor.  Lie down in a dark, quiet room when you have a headache.  If directed, apply ice to the head and neck area:  Put ice in a plastic bag.  Place a towel between your skin and the bag.  Leave the ice on for 20 minutes, 2-3 times per day.  Use a heating pad or hot shower to apply heat to the head and neck area as told by your doctor.  Keep lights dim if bright lights bother you or make your headaches worse. Eating and drinking  Eat meals on a regular schedule.  Lessen how much alcohol you drink.  Lessen how much caffeine you drink, or stop drinking caffeine. General instructions  Keep all follow-up visits as told by your doctor. This is important.  Keep a journal to find out if certain things bring on headaches. For example, write down:  What you eat and drink.  How much sleep you get.  Any change to your diet or medicines.  Relax by getting a massage or doing other relaxing activities.  Lessen stress.  Sit up straight. Do not tighten (tense) your muscles.  Do not use tobacco products. This includes cigarettes, chewing tobacco, or e-cigarettes. If you need help quitting, ask your doctor.  Exercise regularly as told by your doctor.  Get enough sleep. This often means 7-9 hours of sleep. Contact a doctor if:  Your symptoms are not helped by medicine.  You have a headache that feels different than the other headaches.  You feel sick to your stomach (nauseous) or you throw up (vomit).  You have a fever. Get help right away if:  Your headache becomes really bad.  You keep throwing up.  You have a stiff neck.  You have trouble  seeing.  You have trouble speaking.  You have pain in the eye or ear.  Your muscles are weak or you lose muscle control.  You lose your balance or have trouble walking.  You feel like you will pass out (faint) or you pass out.  You have confusion. This information is not intended to replace advice given to you by your health care provider. Make sure you discuss any questions you have with your health care provider. Document Released: 04/04/2008 Document Revised: 12/02/2015 Document Reviewed: 10/19/2014  2017 Elsevier  Tension Headache A tension headache is a feeling of pain, pressure, or aching that is often felt over the front and sides of the head. The pain can be dull, or it can feel tight (constricting). Tension headaches are not normally associated with nausea or vomiting, and they do not get worse with physical activity. Tension headaches can last from 30 minutes to several days. This is the most common type of headache. CAUSES The exact cause of this condition is not known. Tension headaches often begin after stress, anxiety, or depression. Other triggers may include:  Alcohol.  Too much caffeine, or caffeine withdrawal.  Respiratory infections, such as colds, flu, or sinus infections.  Dental problems or teeth clenching.  Fatigue.  Holding your head and neck in the same position for a long period of time, such as while using a computer.  Smoking. SYMPTOMS Symptoms of this  condition include:  A feeling of pressure around the head.  Dull, aching head pain.  Pain felt over the front and sides of the head.  Tenderness in the muscles of the head, neck, and shoulders. DIAGNOSIS This condition may be diagnosed based on your symptoms and a physical exam. Tests may be done, such as a CT scan or an MRI of your head. These tests may be done if your symptoms are severe or unusual. TREATMENT This condition may be treated with lifestyle changes and medicines to help relieve  symptoms. HOME CARE INSTRUCTIONS Managing Pain   Take over-the-counter and prescription medicines only as told by your health care provider.  Lie down in a dark, quiet room when you have a headache.  If directed, apply ice to the head and neck area:  Put ice in a plastic bag.  Place a towel between your skin and the bag.  Leave the ice on for 20 minutes, 2-3 times per day.  Use a heating pad or a hot shower to apply heat to the head and neck area as told by your health care provider. Eating and Drinking   Eat meals on a regular schedule.  Limit alcohol use.  Decrease your caffeine intake, or stop using caffeine. General Instructions   Keep all follow-up visits as told by your health care provider. This is important.  Keep a headache journal to help find out what may trigger your headaches. For example, write down:  What you eat and drink.  How much sleep you get.  Any change to your diet or medicines.  Try massage or other relaxation techniques.  Limit stress.  Sit up straight, and avoid tensing your muscles.  Do not use tobacco products, including cigarettes, chewing tobacco, or e-cigarettes. If you need help quitting, ask your health care provider.  Exercise regularly as told by your health care provider.  Get 7-9 hours of sleep, or the amount recommended by your health care provider. SEEK MEDICAL CARE IF:  Your symptoms are not helped by medicine.  You have a headache that is different from what you normally experience.  You have nausea or you vomit.  You have a fever. SEEK IMMEDIATE MEDICAL CARE IF:  Your headache becomes severe.  You have repeated vomiting.  You have a stiff neck.  You have a loss of vision.  You have problems with speech.  You have pain in your eye or ear.  You have muscular weakness or loss of muscle control.  You lose your balance or you have trouble walking.  You feel faint or you pass out.  You have  confusion. This information is not intended to replace advice given to you by your health care provider. Make sure you discuss any questions you have with your health care provider. Document Released: 06/26/2005 Document Revised: 03/17/2015 Document Reviewed: 10/19/2014 Elsevier Interactive Patient Education  2017 [employer].

## 2016-06-08 ENCOUNTER — Encounter: Payer: Self-pay | Admitting: Psychiatry

## 2016-06-08 ENCOUNTER — Inpatient Hospital Stay
Admission: EM | Admit: 2016-06-08 | Discharge: 2016-06-12 | DRG: 881 | Disposition: A | Discharge status: Home / Self Care | Payer: Medicaid Other | Source: Intra-hospital | Attending: Psychiatry | Admitting: Psychiatry

## 2016-06-08 DIAGNOSIS — E785 Hyperlipidemia, unspecified: Secondary | ICD-10-CM | POA: Diagnosis not present

## 2016-06-08 DIAGNOSIS — Z8249 Family history of ischemic heart disease and other diseases of the circulatory system: Secondary | ICD-10-CM

## 2016-06-08 DIAGNOSIS — Z79899 Other long term (current) drug therapy: Secondary | ICD-10-CM

## 2016-06-08 DIAGNOSIS — F329 Major depressive disorder, single episode, unspecified: Secondary | ICD-10-CM | POA: Diagnosis not present

## 2016-06-08 DIAGNOSIS — R112 Nausea with vomiting, unspecified: Secondary | ICD-10-CM | POA: Diagnosis present

## 2016-06-08 DIAGNOSIS — D649 Anemia, unspecified: Secondary | ICD-10-CM | POA: Diagnosis present

## 2016-06-08 DIAGNOSIS — R51 Headache: Secondary | ICD-10-CM | POA: Diagnosis present

## 2016-06-08 DIAGNOSIS — G4700 Insomnia, unspecified: Secondary | ICD-10-CM | POA: Diagnosis present

## 2016-06-08 DIAGNOSIS — C50919 Malignant neoplasm of unspecified site of unspecified female breast: Secondary | ICD-10-CM | POA: Diagnosis present

## 2016-06-08 DIAGNOSIS — F333 Major depressive disorder, recurrent, severe with psychotic symptoms: Secondary | ICD-10-CM

## 2016-06-08 DIAGNOSIS — G8929 Other chronic pain: Secondary | ICD-10-CM | POA: Diagnosis present

## 2016-06-08 DIAGNOSIS — Z90710 Acquired absence of both cervix and uterus: Secondary | ICD-10-CM

## 2016-06-08 DIAGNOSIS — Z79811 Long term (current) use of aromatase inhibitors: Secondary | ICD-10-CM | POA: Diagnosis not present

## 2016-06-08 DIAGNOSIS — Q048 Other specified congenital malformations of brain: Secondary | ICD-10-CM

## 2016-06-08 DIAGNOSIS — I679 Cerebrovascular disease, unspecified: Secondary | ICD-10-CM

## 2016-06-08 DIAGNOSIS — F4310 Post-traumatic stress disorder, unspecified: Secondary | ICD-10-CM | POA: Diagnosis present

## 2016-06-08 DIAGNOSIS — E860 Dehydration: Secondary | ICD-10-CM | POA: Diagnosis not present

## 2016-06-08 DIAGNOSIS — N179 Acute kidney failure, unspecified: Secondary | ICD-10-CM | POA: Diagnosis not present

## 2016-06-08 DIAGNOSIS — Z853 Personal history of malignant neoplasm of breast: Secondary | ICD-10-CM

## 2016-06-08 DIAGNOSIS — K219 Gastro-esophageal reflux disease without esophagitis: Secondary | ICD-10-CM | POA: Diagnosis not present

## 2016-06-08 DIAGNOSIS — I69351 Hemiplegia and hemiparesis following cerebral infarction affecting right dominant side: Secondary | ICD-10-CM | POA: Diagnosis not present

## 2016-06-08 DIAGNOSIS — K2900 Acute gastritis without bleeding: Secondary | ICD-10-CM | POA: Diagnosis not present

## 2016-06-08 DIAGNOSIS — R45851 Suicidal ideations: Secondary | ICD-10-CM | POA: Diagnosis present

## 2016-06-08 DIAGNOSIS — F332 Major depressive disorder, recurrent severe without psychotic features: Secondary | ICD-10-CM | POA: Diagnosis not present

## 2016-06-08 DIAGNOSIS — R7303 Prediabetes: Secondary | ICD-10-CM | POA: Diagnosis present

## 2016-06-08 DIAGNOSIS — I10 Essential (primary) hypertension: Secondary | ICD-10-CM | POA: Diagnosis not present

## 2016-06-08 DIAGNOSIS — Z9181 History of falling: Secondary | ICD-10-CM

## 2016-06-08 DIAGNOSIS — R519 Headache, unspecified: Secondary | ICD-10-CM | POA: Diagnosis present

## 2016-06-08 LAB — LIPID PANEL
CHOLESTEROL: 223 mg/dL — AB (ref 0–200)
HDL: 31 mg/dL — AB (ref >40)
LDL CALC: UNDETERMINED mg/dL (ref 0–99)
TRIGLYCERIDES: 595 mg/dL — AB (ref <150)
Total CHOL/HDL Ratio: 7.2 RATIO
VLDL: UNDETERMINED mg/dL (ref 0–40)

## 2016-06-08 LAB — TSH: TSH: 0.727 u[IU]/mL (ref 0.350–4.500)

## 2016-06-08 LAB — VITAMIN B12: Vitamin B-12: 278 pg/mL (ref 180–914)

## 2016-06-08 MED ORDER — AMITRIPTYLINE HCL 50 MG PO TABS
25.0000 mg | ORAL_TABLET | Freq: Every day | ORAL | Status: DC
  Administered 2016-06-08 – 2016-06-11 (×4): 25 mg via ORAL
  Filled 2016-06-08 (×2): qty 1
  Filled 2016-06-08: qty 2
  Filled 2016-06-08: qty 1

## 2016-06-08 MED ORDER — FLUOXETINE HCL 20 MG PO CAPS
40.0000 mg | ORAL_CAPSULE | Freq: Every day | ORAL | Status: DC
  Administered 2016-06-09 – 2016-06-12 (×4): 40 mg via ORAL
  Filled 2016-06-08 (×4): qty 2

## 2016-06-08 MED ORDER — ACETAMINOPHEN 500 MG PO TABS
1000.0000 mg | ORAL_TABLET | Freq: Four times a day (QID) | ORAL | Status: DC | PRN
  Administered 2016-06-08 – 2016-06-12 (×6): 1000 mg via ORAL
  Filled 2016-06-08 (×7): qty 2

## 2016-06-08 MED ORDER — MAGNESIUM HYDROXIDE 400 MG/5ML PO SUSP: 30.0000 mL | Freq: Every day | ORAL | Status: DC | PRN

## 2016-06-08 MED ORDER — ALUM & MAG HYDROXIDE-SIMETH 200-200-20 MG/5ML PO SUSP
30.0000 mL | ORAL | Status: DC | PRN
  Administered 2016-06-12: 30 mL via ORAL
  Filled 2016-06-08: qty 30

## 2016-06-08 MED ORDER — LETROZOLE 2.5 MG PO TABS
2.5000 mg | ORAL_TABLET | Freq: Every day | ORAL | Status: DC
  Administered 2016-06-09 – 2016-06-12 (×4): 2.5 mg via ORAL
  Filled 2016-06-08 (×4): qty 1

## 2016-06-08 MED ORDER — LETROZOLE 2.5 MG PO TABS: 2.5000 mg | ORAL_TABLET | Freq: Every day | ORAL | Status: DC

## 2016-06-08 MED ORDER — HYDROXYZINE HCL 25 MG PO TABS
25.0000 mg | ORAL_TABLET | Freq: Three times a day (TID) | ORAL | Status: DC | PRN
  Filled 2016-06-08: qty 1

## 2016-06-08 MED ORDER — ACETAMINOPHEN 325 MG PO TABS: 650.0000 mg | ORAL_TABLET | Freq: Four times a day (QID) | ORAL | Status: DC | PRN

## 2016-06-08 MED ORDER — LUBIPROSTONE 24 MCG PO CAPS
24.0000 ug | ORAL_CAPSULE | Freq: Two times a day (BID) | ORAL | Status: DC
  Administered 2016-06-08 – 2016-06-12 (×5): 24 ug via ORAL
  Filled 2016-06-08 (×7): qty 1

## 2016-06-08 MED ORDER — HYDROCHLOROTHIAZIDE 12.5 MG PO CAPS
12.5000 mg | ORAL_CAPSULE | Freq: Every day | ORAL | Status: DC
Start: 2016-06-09 — End: 2016-06-12
  Administered 2016-06-09 – 2016-06-12 (×4): 12.5 mg via ORAL
  Filled 2016-06-08 (×4): qty 1

## 2016-06-08 MED ORDER — VITAMIN C 500 MG PO TABS
500.0000 mg | ORAL_TABLET | Freq: Every day | ORAL | Status: DC
  Administered 2016-06-09 – 2016-06-12 (×4): 500 mg via ORAL
  Filled 2016-06-08 (×4): qty 1

## 2016-06-08 MED ORDER — FERROUS SULFATE 325 (65 FE) MG PO TABS
325.0000 mg | ORAL_TABLET | Freq: Every day | ORAL | Status: DC
  Administered 2016-06-08 – 2016-06-12 (×5): 325 mg via ORAL
  Filled 2016-06-08 (×6): qty 1

## 2016-06-08 MED ORDER — PANTOPRAZOLE SODIUM 40 MG PO TBEC
40.0000 mg | DELAYED_RELEASE_TABLET | Freq: Every day | ORAL | Status: DC
  Administered 2016-06-08 – 2016-06-11 (×4): 40 mg via ORAL
  Filled 2016-06-08 (×4): qty 1

## 2016-06-08 MED ORDER — LISINOPRIL 10 MG PO TABS
10.0000 mg | ORAL_TABLET | Freq: Every day | ORAL | Status: DC
  Administered 2016-06-09 – 2016-06-12 (×4): 10 mg via ORAL
  Filled 2016-06-08 (×4): qty 1

## 2016-06-08 MED ORDER — QUETIAPINE FUMARATE 200 MG PO TABS
200.0000 mg | ORAL_TABLET | Freq: Every day | ORAL | Status: DC
  Administered 2016-06-08 – 2016-06-11 (×4): 200 mg via ORAL
  Filled 2016-06-08 (×4): qty 1

## 2016-06-08 MED ORDER — VITAMIN C 500 MG PO TABS: 500.0000 mg | ORAL_TABLET | Freq: Every day | ORAL | Status: DC

## 2016-06-08 MED ORDER — FLUOXETINE HCL 40 MG PO CAPS: 40.0000 mg | ORAL_CAPSULE | Freq: Every day | ORAL | Status: DC

## 2016-06-08 NOTE — Plan of Care (Signed)
Problem: Coping: Goal: Ability to cope will improve Outcome: Progressing Attending unit programing  working on coping skills
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
## 2016-06-08 NOTE — Progress Notes (Signed)
Admission Note:  D: Pt appeared depressed  With  a flat affect.  Pt  denies SI / AVH at this time. Patient's beliefs of brain matter draining down into her spine  .Dead sister talking to her . Hx of . HTN  IBS Right breast Cancer  And hypothyroid.  Pt is redirectable and cooperative with assessment. Past CVA weak right side   Walks with a cane . Patient  Walker .      A: Pt admitted to unit per protocol, skin assessment and search done and no contraband found.  Pt  educated on therapeutic milieu rules. Pt was introduced to milieu by nursing staff.    R: Pt was receptive to education about the milieu .  15 min safety checks started. Probation officer offered support
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
## 2016-06-08 NOTE — Tx Team (Signed)
Initial Treatment Plan 06/08/2016 3:53 PM Jerilynn Som [id number]    PATIENT STRESSORS: Health problems Occupational concerns   PATIENT STRENGTHS: Ability for insight Active sense of humor Average or above average intelligence Capable of independent living Communication skills Supportive family/friends   PATIENT IDENTIFIED PROBLEMS: Depression 06/08/16  Suicidal 06/08/16  Psychosis 06/08/16  CVA 06/08/16               DISCHARGE CRITERIA:  Ability to meet basic life and health needs Improved stabilization in mood, thinking, and/or behavior Motivation to continue treatment in a less acute level of care  PRELIMINARY DISCHARGE PLAN: Outpatient therapy Return to previous living arrangement  PATIENT/FAMILY INVOLVEMENT: This treatment plan has been presented to and reviewed with the patient, April Pham, and/or family member,  .  The patient and family have been given the opportunity to ask questions and make suggestions.  Leodis Liverpool, RN 06/08/2016, 3:53 PM

## 2016-06-08 NOTE — BHH Suicide Risk Assessment (Signed)
[facility] Admission Suicide Risk Assessment   Nursing information obtained from:    Demographic factors:    Current Mental Status:    Loss Factors:    Historical Factors:    Risk Reduction Factors:     Total Time spent with patient:  Principal Problem: MDD (major depressive disorder) Diagnosis:   Patient Active Problem List   Diagnosis Date Noted  . MDD (major depressive disorder) [F32.9] 06/08/2016  . Cerebrovascular disease [I67.9] 06/08/2016  . Headache [R51] 05/24/2016  . Essential hypertension [I10] 05/24/2016  . Normochromic normocytic anemia [D64.9] 05/24/2016  . History of breast cancer [Z85.3] 05/24/2016   Subjective Data:   Continued Clinical Symptoms:  Alcohol Use Disorder Identification Test Final Score (AUDIT): 0 The "Alcohol Use Disorders Identification Test", Guidelines for Use in Primary Care, Second Edition.  World Pharmacologist [facility]). Score between 0-7:  no or low risk or alcohol related problems. Score between 8-15:  moderate risk of alcohol related problems. Score between 16-19:  high risk of alcohol related problems. Score 20 or above:  warrants further diagnostic evaluation for alcohol dependence and treatment.   CLINICAL FACTORS:   Depression:   Insomnia Chronic Pain Previous Psychiatric Diagnoses and Treatments Medical Diagnoses and Treatments/Surgeries     Psychiatric Specialty Exam: Physical Exam  ROS  Blood pressure 136/66, pulse 83, temperature 97.8 F (36.6 C), temperature source Oral, resp. rate (!) 166, height 5\' 6"  (1.676 m), weight 86.2 kg (190 lb), SpO2 92 %.Body mass index is 30.67 kg/m.                                                    Sleep:         COGNITIVE FEATURES THAT CONTRIBUTE TO RISK:  Loss of executive function    SUICIDE RISK:   Mild:  Suicidal ideation of limited frequency, intensity, duration, and specificity.  There are no identifiable plans, no associated intent, mild dysphoria and  related symptoms, good self-control (both objective and subjective assessment), few other risk factors, and identifiable protective factors, including available and accessible social support.   PLAN OF CARE: admit to [facility]  I certify that inpatient services furnished can reasonably be expected to improve the patient's condition.  Hildred Priest, MD 06/08/2016, 4:08 PM

## 2016-06-08 NOTE — Plan of Care (Signed)
Problem: Skin Integrity: Goal: Risk for impaired skin integrity will decrease Outcome: Progressing Patient moving  About unit  Encourage  Patient participation.

## 2016-06-08 NOTE — BH Assessment (Signed)
Patient has been accepted to [facility].  Accepting physician is Dr. Jerilee Hoh Attending Physician will be Dr. Jerilee Hoh Patient has been assigned to room 301, by [place].  Call report to [phone].  Representative/Transfer Coordinator is Lurline Idol Patient pre-admitted by [facility] Patient Access Bubba Hales)   [facility] Staff Mendel Ryder [facility] and [place], TTS/social work) made aware of acceptance.

## 2016-06-08 NOTE — BHH Group Notes (Signed)
[place] Group Notes:  (Nursing/MHT/Case Management/Adjunct)  Date:  06/08/2016  Time:  3:53 PM  Type of Therapy:  Psychoeducational Skills  Participation Level:  Active  Participation Quality:  Appropriate, Attentive and Supportive  Affect:  Flat  Cognitive:  Appropriate  Insight:  Appropriate  Engagement in Group:  Engaged and Supportive  Modes of Intervention:  Discussion, Education and Exploration  Summary of Progress/Problems:  Charise Killian 06/08/2016, 3:53 PM

## 2016-06-09 DIAGNOSIS — E785 Hyperlipidemia, unspecified: Secondary | ICD-10-CM

## 2016-06-09 LAB — HEMOGLOBIN A1C
HEMOGLOBIN A1C: 5.8 % — AB (ref 4.8–5.6)
Mean Plasma Glucose: 120 mg/dL

## 2016-06-09 MED ORDER — GEMFIBROZIL 600 MG PO TABS
600.0000 mg | ORAL_TABLET | Freq: Two times a day (BID) | ORAL | Status: DC
  Administered 2016-06-10 – 2016-06-12 (×5): 600 mg via ORAL
  Filled 2016-06-09 (×5): qty 1

## 2016-06-09 MED ORDER — ATORVASTATIN CALCIUM 20 MG PO TABS
10.0000 mg | ORAL_TABLET | Freq: Every day | ORAL | Status: DC
  Administered 2016-06-09 – 2016-06-11 (×3): 10 mg via ORAL
  Filled 2016-06-09 (×3): qty 1

## 2016-06-09 MED ORDER — LORATADINE 10 MG PO TABS
10.0000 mg | ORAL_TABLET | Freq: Every day | ORAL | Status: DC
  Administered 2016-06-10 – 2016-06-12 (×3): 10 mg via ORAL
  Filled 2016-06-09 (×3): qty 1

## 2016-06-09 NOTE — Progress Notes (Signed)
D: Pt spends most of the evening in the milieu interacting with staff and peers. Upon approach, she talks about seeing her sister who died 3 years ago, stating "I thought she would visit me tonight but she hasn't." Pt c/o h/a and requests PRN medication. Pt rates depression 10/10. She denies SI/HI at this time. A: Emotional support and encouragement provided. Medications administered with education. q15 minute safety checks maintained.  R: Pt remains free from harm. Will continue to monitor.

## 2016-06-09 NOTE — BHH Group Notes (Signed)
[place] Group Notes:  (Nursing/MHT/Case Management/Adjunct)  Date:  06/09/2016  Time:  4:15 PM  Type of Therapy:  Group Therapy  Participation Level:  Active  Participation Quality:  Appropriate  Affect:  Appropriate  Cognitive:  Appropriate  Insight:  Appropriate  Engagement in Group:  Engaged  Modes of Intervention:  Support  Summary of Progress/Problems:  Coralie Common 06/09/2016, 4:15 PM

## 2016-06-09 NOTE — BHH Group Notes (Signed)
[place] Group Notes:  (Nursing/MHT/Case Management/Adjunct)  Date:  06/09/2016  Time:  3:42 AM  Type of Therapy:  Psychoeducational Skills  Participation Level:  Active  Participation Quality:  Appropriate, Attentive and Sharing  Affect:  Appropriate  Cognitive:  Appropriate  Insight:  Appropriate and Good  Engagement in Group:  Engaged  Modes of Intervention:  Discussion, Socialization and Support  Summary of Progress/Problems:  Reece Agar 06/09/2016, 3:42 AM

## 2016-06-09 NOTE — Plan of Care (Signed)
Problem: Education: Goal: Will be free of psychotic symptoms Outcome: Not Progressing Pt reports hearing her sister talk to her. She states "I thought she would visit me tonight but she hasn't." Pt states that sister died 3 years ago.  Problem: Health Behavior/Discharge Planning: Goal: Compliance with prescribed medication regimen will improve Outcome: Progressing Pt takes medications as prescribed and verbalizes understanding of teaching provided.
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
## 2016-06-09 NOTE — Plan of Care (Signed)
Problem: Coping: Goal: Ability to cope will improve Outcome: Progressing Patient reports feeling less anxious, headache has decreased.

## 2016-06-09 NOTE — Tx Team (Signed)
Interdisciplinary Treatment and Diagnostic Plan Update  06/09/2016 Time of Session: 10:30am April Pham MRN: [id number]  Principal Diagnosis: MDD (major depressive disorder)  Secondary Diagnoses: Principal Problem:   MDD (major depressive disorder) Active Problems:   Headache   Essential hypertension   Normochromic normocytic anemia   History of breast cancer   Cerebrovascular disease   Current Medications:  Current Facility-Administered Medications  Medication Dose Route Frequency Provider Last Rate Last Dose  . acetaminophen (TYLENOL) tablet 1,000 mg  1,000 mg Oral Q6H PRN Hildred Priest, MD   1,000 mg at 06/08/16 2126  . alum & mag hydroxide-simeth (MAALOX/MYLANTA) 200-200-20 MG/5ML suspension 30 mL  30 mL Oral Q4H PRN Hildred Priest, MD      . amitriptyline (ELAVIL) tablet 25 mg  25 mg Oral QHS Hildred Priest, MD   25 mg at 06/08/16 2123  . atorvastatin (LIPITOR) tablet 10 mg  10 mg Oral q1800 Hildred Priest, MD      . ferrous sulfate tablet 325 mg  325 mg Oral Daily Hildred Priest, MD   325 mg at 06/09/16 0820  . FLUoxetine (PROZAC) capsule 40 mg  40 mg Oral Daily Hildred Priest, MD   40 mg at 06/09/16 0820  . hydrochlorothiazide (MICROZIDE) capsule 12.5 mg  12.5 mg Oral Daily Hildred Priest, MD   12.5 mg at 06/09/16 0820  . hydrOXYzine (ATARAX/VISTARIL) tablet 25 mg  25 mg Oral TID PRN Hildred Priest, MD      . letrozole [facility]) tablet 2.5 mg  2.5 mg Oral Daily Hildred Priest, MD   2.5 mg at 06/09/16 0820  . lisinopril (PRINIVIL,ZESTRIL) tablet 10 mg  10 mg Oral Daily Hildred Priest, MD   10 mg at 06/09/16 0820  . lubiprostone (AMITIZA) capsule 24 mcg  24 mcg Oral BID Hildred Priest, MD   24 mcg at 06/09/16 0820  . magnesium hydroxide (MILK OF MAGNESIA) suspension 30 mL  30 mL Oral Daily PRN Hildred Priest, MD      . pantoprazole (PROTONIX) EC  tablet 40 mg  40 mg Oral Daily Hildred Priest, MD   40 mg at 06/09/16 0820  . QUEtiapine (SEROQUEL) tablet 200 mg  200 mg Oral QHS Hildred Priest, MD   200 mg at 06/08/16 2123  . vitamin C (ASCORBIC ACID) tablet 500 mg  500 mg Oral Daily Hildred Priest, MD   500 mg at 06/09/16 0820   PTA Medications: Prescriptions Prior to Admission  Medication Sig Dispense Refill Last Dose  . AMITIZA 24 MCG capsule Take 24 mcg by mouth 2 (two) times daily.  6 05/23/2016 at AM dose  . b complex vitamins tablet Take 1 tablet by mouth daily.   05/23/2016 at Unknown time  . cetirizine (ZYRTEC) 10 MG tablet Take 10 mg by mouth daily.   05/23/2016 at Unknown time  . ergocalciferol (VITAMIN D2) 50000 units capsule Take 50,000 Units by mouth once a week.   Unknown at Unknown  . ferrous sulfate 325 (65 FE) MG EC tablet Take 325 mg by mouth daily.   05/23/2016 at Unknown time  . FLUoxetine (PROZAC) 40 MG capsule Take 40 mg by mouth daily.   05/23/2016 at Unknown time  . hydrOXYzine (ATARAX/VISTARIL) 25 MG tablet Take 25 mg by mouth 3 (three) times daily as needed for itching.   05/23/2016 at Unknown time  . letrozole (FEMARA) 2.5 MG tablet Take 2.5 mg by mouth daily.   Past Week at Unknown time  . Multiple Vitamins-Minerals (MULTIVITAMIN ADULTS 50+ PO) Take  1 tablet by mouth daily.   05/23/2016 at Unknown time  . omeprazole (PRILOSEC) 20 MG capsule Take 20 mg by mouth 2 (two) times daily before a meal.   05/23/2016 at Unknown time  . oxyCODONE (OXY IR/ROXICODONE) 5 MG immediate release tablet Take 5 mg by mouth every 6 (six) hours as needed for moderate pain.   0 Unknown at Unknown  . QUEtiapine (SEROQUEL) 100 MG tablet Take 200 mg by mouth at bedtime.  5 Past Week at Unknown time  . vitamin C (ASCORBIC ACID) 500 MG tablet Take 500 mg by mouth daily.   05/23/2016 at Unknown time    Patient Stressors: Health problems Occupational concerns  Patient Strengths: Ability for insight Active  sense of humor Average or above average intelligence Capable of independent living Communication skills Supportive family/friends  Treatment Modalities: Medication Management, Group therapy, Case management,  1 to 1 session with clinician, Psychoeducation, Recreational therapy.   Physician Treatment Plan for Primary Diagnosis: MDD (major depressive disorder) Long Term Goal(s): Improvement in symptoms so as ready for discharge Improvement in symptoms so as ready for discharge   Short Term Goals: Ability to identify changes in lifestyle to reduce recurrence of condition will improve Ability to verbalize feelings will improve Ability to disclose and discuss suicidal ideas Ability to demonstrate self-control will improve Ability to identify and develop effective coping behaviors will improve Ability to identify triggers associated with substance abuse/mental health issues will improve Ability to identify changes in lifestyle to reduce recurrence of condition will improve Ability to identify and develop effective coping behaviors will improve Ability to identify triggers associated with substance abuse/mental health issues will improve  Medication Management: Evaluate patient's response, side effects, and tolerance of medication regimen.  Therapeutic Interventions: 1 to 1 sessions, Unit Group sessions and Medication administration.  Evaluation of Outcomes: Progressing  Physician Treatment Plan for Secondary Diagnosis: Principal Problem:   MDD (major depressive disorder) Active Problems:   Headache   Essential hypertension   Normochromic normocytic anemia   History of breast cancer   Cerebrovascular disease  Long Term Goal(s): Improvement in symptoms so as ready for discharge Improvement in symptoms so as ready for discharge   Short Term Goals: Ability to identify changes in lifestyle to reduce recurrence of condition will improve Ability to verbalize feelings will  improve Ability to disclose and discuss suicidal ideas Ability to demonstrate self-control will improve Ability to identify and develop effective coping behaviors will improve Ability to identify triggers associated with substance abuse/mental health issues will improve Ability to identify changes in lifestyle to reduce recurrence of condition will improve Ability to identify and develop effective coping behaviors will improve Ability to identify triggers associated with substance abuse/mental health issues will improve     Medication Management: Evaluate patient's response, side effects, and tolerance of medication regimen.  Therapeutic Interventions: 1 to 1 sessions, Unit Group sessions and Medication administration.  Evaluation of Outcomes: Progressing   RN Treatment Plan for Primary Diagnosis: MDD (major depressive disorder) Long Term Goal(s): Knowledge of disease and therapeutic regimen to maintain health will improve  Short Term Goals: Ability to verbalize feelings will improve and Ability to disclose and discuss suicidal ideas  Medication Management: RN will administer medications as ordered by provider, will assess and evaluate patient's response and provide education to patient for prescribed medication. RN will report any adverse and/or side effects to prescribing provider.  Therapeutic Interventions: 1 on 1 counseling sessions, Psychoeducation, Medication administration, Evaluate responses to treatment, Monitor  vital signs and CBGs as ordered, Perform/monitor CIWA, COWS, AIMS and Fall Risk screenings as ordered, Perform wound care treatments as ordered.  Evaluation of Outcomes: Progressing   LCSW Treatment Plan for Primary Diagnosis: MDD (major depressive disorder) Long Term Goal(s): Safe transition to appropriate next level of care at discharge, Engage patient in therapeutic group addressing interpersonal concerns.  Short Term Goals: Engage patient in aftercare planning  with referrals and resources, Increase social support, Increase ability to appropriately verbalize feelings, Increase emotional regulation, Facilitate acceptance of mental health diagnosis and concerns and Increase skills for wellness and recovery  Therapeutic Interventions: Assess for all discharge needs, 1 to 1 time with Social worker, Explore available resources and support systems, Assess for adequacy in community support network, Educate family and significant other(s) on suicide prevention, Complete Psychosocial Assessment, Interpersonal group therapy.  Evaluation of Outcomes: Progressing   Progress in Treatment: Attending groups: Yes. Participating in groups: Yes. Taking medication as prescribed: Yes. Toleration medication: Yes. Family/Significant other contact made: No, will contact:  pt's husband Shellye Kloepfer at ph: [phone] Patient understands diagnosis: Yes. Discussing patient identified problems/goals with staff: Yes. Medical problems stabilized or resolved: Yes. Denies suicidal/homicidal ideation: Yes. Issues/concerns per patient self-inventory: Yes. Other: none   New problem(s) identified: No, Describe:  none listed  New Short Term/Long Term Goal(s):  Discharge Plan or Barriers: Pt will discharge home to [place] to live with the pt's husband and will follow up with [facility] for medication management and therapy.    Reason for Continuation of Hospitalization: Anxiety Delusions  Depression Hallucinations Medical Issues Medication stabilization Suicidal ideation  Estimated Length of Stay:  Attendees: Patient: April Pham 06/09/2016 10:59 AM  Physician: Dr. Jerilee Hoh, MD  06/09/2016 10:59 AM  Nursing: Floyde Parkins, RN 06/09/2016 10:59 AM  RN Care Manager: 06/09/2016 10:59 AM  Social Worker: Alphonse Guild. Daine Gravel, LCAS   06/09/2016 10:59 AM  Recreational Therapist: Everitt Amber, LRT 06/09/2016 10:59 AM  Other:  06/09/2016 10:59 AM  Other:  06/09/2016 10:59  AM  Other: 06/09/2016 10:59 AM    Scribe for Treatment Team: Alphonse Guild Joby Richart, LCSWA

## 2016-06-09 NOTE — Progress Notes (Signed)
Recreation Therapy Notes  Date: 12.01.17 Time: 9:30 am Location: Craft Room  Group Topic: Coping Skills  Goal Area(s) Addresses:  Patient will participate in healthy coping skill. Patient will verbalize benefit of using art as a coping skill.  Behavioral Response: Did not attend  Intervention: Coloring  Activity: Patients were given coloring sheets and were instructed to think about the emotions they were feeling as well as what their minds were focused on.  Education: LRT educated patients on healthy coping skills.  Education Outcome: Patient did not attend group.  Clinical Observations/Feedback: Patient did not attend group.  Leonette Monarch, LRT/CTRS 06/09/2016 10:20 AM

## 2016-06-09 NOTE — Clinical Social Work Note (Signed)
[place] LCSW Group Therapy  06/09/2016 3:08 PM  Type of Therapy:  Group Therapy  Participation Level:  Active  Participation Quality:  Appropriate  Affect:  Appropriate  Cognitive:  Appropriate  Insight:  Developing/Improving  Engagement in Therapy:  Developing/Improving  Modes of Intervention:  Discussion, Education, Exploration and Problem-solving  Summary of Progress/Problems:LCSW introduced the topic over relapse prevention. This patient was asked to reflect and share warning signs for relapses. Patient was asked to reflect on constructive steps to prevent relapse from occurring. LCSW and peers reviewed positive outcomes and future coping strategies and engaged in an open ended discussion providing support to peers and patient.   Pauletta Browns, Haruka Kowaleski M 06/09/2016, 3:08 PM

## 2016-06-10 DIAGNOSIS — F332 Major depressive disorder, recurrent severe without psychotic features: Secondary | ICD-10-CM

## 2016-06-10 MED ORDER — TRAZODONE HCL 100 MG PO TABS
100.0000 mg | ORAL_TABLET | Freq: Every day | ORAL | Status: DC
  Administered 2016-06-10 – 2016-06-11 (×2): 100 mg via ORAL
  Filled 2016-06-10 (×2): qty 1

## 2016-06-10 NOTE — Progress Notes (Signed)
D: Patient is alert and oriented on the unit this shift. Patient attended and actively participated in groups today. Patient denies suicidal ideation, homicidal ideation, auditory or visual hallucinations at the present time.  A: Scheduled medications are administered to patient as per MD orders. Emotional support and encouragement are provided. Patient is maintained on q.15 minute safety checks. Patient is informed to notify staff with questions or concerns. R: No adverse medication reactions are noted. Patient is cooperative with medication administration and treatment plan today. Patient is receptive, calm and cooperative on the unit at this time. Patient interacts well with others on the unit this shift. Patient contracts for safety at this time. Patient remains safe at this time. Patient ambulates with walkr and haas bsc for fall risk safety. Pt close to nurse station

## 2016-06-10 NOTE — Progress Notes (Signed)
D: Pt is pleasant and cooperative this evening. She presents with flat affect. Pt asks writer about prescribed medications and requests a list of current medications. Pt reviews PTA meds with Probation officer and asks questions appropriately. She continues to c/o h/a pain and requests PRN medication. Pt denies SI/HI. She reports visual hallucinations "I see a [place] thing dancing outside my room. I thought I might be my sister but it isn't." A: Emotional support and encouragement provided. Medications administered with education. q15 minute safety checks maintained. R: Pt remains free from harm. Will continue to monitor.

## 2016-06-10 NOTE — Progress Notes (Signed)
Patient has been in the day area all day interacting with staff and peers. Has been playing cards with staff and peers as well. Has requested prn pain medication for c/o Ha. Denies SI/HI or AVH this morning. Has been medication compliant and very pleasant. Remains on Q15 minute checks for safety. Will continue to monitor.

## 2016-06-10 NOTE — Plan of Care (Signed)
Problem: Coping: Goal: Ability to cope will improve Outcome: Not Progressing Patient not able to utilize coping skills at this time [employer]
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
## 2016-06-10 NOTE — Progress Notes (Signed)
[facility] LCSW Group Therapy  06/10/2016 2pm  Type of Therapy:  Group Therapy  Participation Level:  Active  Participation Quality:  Appropriate  Affect:  Appropriate  Cognitive:  Appropriate  Insight:  Developing/Improving  Engagement in Therapy:  Engaged  Modes of Intervention:  Discussion, Education, Exploration and Support  Type of Therapy/Topic: Group Therapy: Balance in Life    This group will address the concept of balance and how it feels and looks when one is unbalanced. Patients will be encouraged to process areas in their lives that are out of balance, and identify reasons for remaining unbalanced. Facilitators will guide patients utilizing problem- solving interventions to address and correct the stressor making their life unbalanced. Understanding and applying boundaries will be explored and addressed for obtaining and maintaining a balanced life. Patients will be encouraged to explore ways to assertively make their unbalanced needs known to significant others in their lives, using other group members and facilitator for support and feedback.  Therapeutic Goals:  1. Patient will identify two or more emotions or situations they have that consume much of in their lives.  2. Patient will identify signs/triggers that life has become out of balance:  3. Patient will identify two ways to set boundaries in order to achieve balance in their lives:  4. Patient will demonstrate ability to communicate their needs through discussion and/or role plays    Summary of Patient Progress:  This patient was very attentive to her peers and and was able to reflect on times in her life when things were unbalanced and had good insight as to what needs to take place to regain that balance.    Enis Slipper M 06/10/2016, 2:31 PM

## 2016-06-10 NOTE — Plan of Care (Signed)
Problem: Education: Goal: Knowledge of the prescribed therapeutic regimen will improve Outcome: Progressing Pt discusses prescribed medications with writer and reviews PTA medications. Pt requests a list of current prescribed medications and verbalizes understanding of medication uses.  Problem: Self-Concept: Goal: Ability to disclose and discuss suicidal ideas will improve Outcome: Progressing Pt denies SI at this time.

## 2016-06-10 NOTE — Plan of Care (Signed)
Problem: Safety: Goal: Ability to remain free from injury will improve Outcome: Progressing Patient has remained free from injury   

## 2016-06-10 NOTE — [facility] Group Notes (Signed)
[place] Group Notes:  (Nursing/MHT/Case Management/Adjunct)  Date:  06/10/2016  Time:  4:53 AM  Type of Therapy:  Psychoeducational Skills  Participation Level:  Active  Participation Quality:  Appropriate  Affect:  Appropriate  Cognitive:  Appropriate  Insight:  Appropriate and Good  Engagement in Group:  Engaged  Modes of Intervention:  Discussion, Socialization and Support  Summary of Progress/Problems:  Reece Agar 06/10/2016, 4:53 AM

## 2016-06-11 NOTE — Plan of Care (Signed)
Problem: Safety: Goal: Ability to remain free from injury will improve Outcome: Progressing Patient encouraged to use her walker for ambulation. No falls reported.

## 2016-06-11 NOTE — Progress Notes (Signed)
LCSW engaged with this patient at group and provided 1-1 support after group. 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 She was concerned she has a neurology appointment at noon. We called patients daughter with her verbal consent and it was confirmed her daughter will visit today and bring her fresh clothes for tomorrows discharge and also be the one to take her home/ and to her appointment tomorrow.  Patient reports she feels better that her family understands her needs but is emotional and doesn't want to trouble anyone. LCSW encouraged her to ask her family for support. Patient and LCSW attempted to reach her husband but he did not answer the phone. He was emotional too when he visited and wants her to come home soon.  [employer] LCSW [phone]
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
## 2016-06-11 NOTE — BHH Group Notes (Signed)
[place] Group Notes:  (Nursing/MHT/Case Management/Adjunct)  Date:  06/11/2016  Time:  6:06 AM  Type of Therapy:  Psychoeducational Skills  Participation Level:  Active  Participation Quality:  Appropriate  Affect:  Appropriate  Cognitive:  Alert  Insight:  Good  Engagement in Group:  Engaged  Modes of Intervention:  Activity  Summary of Progress/Problems:  April Pham 06/11/2016, 6:06 AM

## 2016-06-11 NOTE — BHH Group Notes (Signed)
[facility] Group Notes:  (Nursing/MHT/Case Management/Adjunct)  Date:  06/11/2016  Time:  10:02 PM  Type of Therapy:  Group Therapy  Participation Level:  Active  Participation Quality:  Appropriate  Affect:  Appropriate  Cognitive:  Appropriate  Insight:  Appropriate  Engagement in Group:  Engaged  Modes of Intervention:  Activity  Summary of Progress/Problems:  Kandis Fantasia 06/11/2016, 10:02 PM

## 2016-06-11 NOTE — BHH Counselor (Signed)
[place] LCSW Group Therapy Note   Date/Time: 1pm Dec 3rd/17  Type of Therapy and Topic: Group Therapy: Communication   Participation Level:  Good  Description of Group: Communicating with others once Discharged  Patients identify how individuals communicate with one another appropriately and inappropriately. Patients will be guided to discuss their thoughts, feelings, and behaviors related to barriers when communicating. The group will process together ways to execute positive and appropriate communications, with attention given to how one uses behavior, tone, and body language. Patient will be encouraged to reflect on a situation where they were successfully able to communicate and the reasons that they believe helped them to communicate. Group will identify specific changes they are motivated to make in order to overcome communication barriers with self, peers, authority, and parents. This group will be process-oriented, with patients participating in exploration of their own experiences as well as giving and receiving support and challenging self as well as other group members.   Therapeutic Goals:  1. Patient will identify how people communicate (body language, facial expression, and electronics) Also discuss tone, voice and how these impact what is communicated and how the message is perceived.  2. Patient will identify feelings (such as fear or worry), thought process and behaviors related to why people internalize feelings rather than express self openly.  3. Patient will identify two changes they are willing to make to overcome communication barriers.  4. Members will then practice through Role Play how to communicate by utilizing psycho-education material (such as I Feel statements and acknowledging feelings rather than displacing on others)   Summary of Patient Progress: This patient was able to support and share her feelings about her family and how they communicate and demand things from  her- "expected " from her. She was encouraged to set healthy boundaries and state NO to unrealistic family request and focus on self care activities.  Therapeutic Modalities:  Cognitive Behavioral Therapy  Solution Focused Therapy  Motivational [place] LCSW [phone]

## 2016-06-11 NOTE — Progress Notes (Signed)
Patient noted to be sad and depressed in mood. She did not interact with peers and only spoke when she needed something. Patient encouraged to drink lots of fluids and use her walker to ambulate. She denied SI/AVH and contracted for safety. Encouraged to verbalize feelings/thoughts to nursing staff. Patient verbalizes understanding.

## 2016-06-12 ENCOUNTER — Observation Stay
Admission: AD | Admit: 2016-06-12 | Discharge: 2016-06-13 | Disposition: A | Discharge status: Home / Self Care | Payer: Medicaid Other | Source: Ambulatory Visit | Attending: Internal Medicine | Admitting: Internal Medicine

## 2016-06-12 DIAGNOSIS — Q048 Other specified congenital malformations of brain: Secondary | ICD-10-CM

## 2016-06-12 DIAGNOSIS — Z79811 Long term (current) use of aromatase inhibitors: Secondary | ICD-10-CM | POA: Insufficient documentation

## 2016-06-12 DIAGNOSIS — N179 Acute kidney failure, unspecified: Secondary | ICD-10-CM | POA: Insufficient documentation

## 2016-06-12 DIAGNOSIS — K2900 Acute gastritis without bleeding: Principal | ICD-10-CM | POA: Diagnosis present

## 2016-06-12 DIAGNOSIS — E860 Dehydration: Secondary | ICD-10-CM | POA: Insufficient documentation

## 2016-06-12 DIAGNOSIS — Z79899 Other long term (current) drug therapy: Secondary | ICD-10-CM | POA: Insufficient documentation

## 2016-06-12 DIAGNOSIS — R109 Unspecified abdominal pain: Secondary | ICD-10-CM

## 2016-06-12 DIAGNOSIS — I10 Essential (primary) hypertension: Secondary | ICD-10-CM | POA: Insufficient documentation

## 2016-06-12 DIAGNOSIS — Z853 Personal history of malignant neoplasm of breast: Secondary | ICD-10-CM | POA: Insufficient documentation

## 2016-06-12 DIAGNOSIS — F332 Major depressive disorder, recurrent severe without psychotic features: Secondary | ICD-10-CM

## 2016-06-12 DIAGNOSIS — K219 Gastro-esophageal reflux disease without esophagitis: Secondary | ICD-10-CM | POA: Insufficient documentation

## 2016-06-12 DIAGNOSIS — D649 Anemia, unspecified: Secondary | ICD-10-CM | POA: Insufficient documentation

## 2016-06-12 DIAGNOSIS — E785 Hyperlipidemia, unspecified: Secondary | ICD-10-CM | POA: Insufficient documentation

## 2016-06-12 DIAGNOSIS — F329 Major depressive disorder, single episode, unspecified: Secondary | ICD-10-CM | POA: Diagnosis present

## 2016-06-12 LAB — URINALYSIS COMPLETE WITH MICROSCOPIC (ARMC ONLY)
BACTERIA UA: NONE SEEN
BACTERIA UA: NONE SEEN
Bilirubin Urine: NEGATIVE
Bilirubin Urine: NEGATIVE
GLUCOSE, UA: NEGATIVE mg/dL
Glucose, UA: NEGATIVE mg/dL
HGB URINE DIPSTICK: NEGATIVE
Hgb urine dipstick: NEGATIVE
KETONES UR: NEGATIVE mg/dL
Ketones, ur: NEGATIVE mg/dL
Nitrite: NEGATIVE
Nitrite: NEGATIVE
PH: 6 (ref 5.0–8.0)
PROTEIN: NEGATIVE mg/dL
Protein, ur: NEGATIVE mg/dL
RBC / HPF: NONE SEEN RBC/hpf (ref 0–5)
Specific Gravity, Urine: 1.013 (ref 1.005–1.030)
Specific Gravity, Urine: 1.013 (ref 1.005–1.030)
pH: 6 (ref 5.0–8.0)

## 2016-06-12 LAB — CBC WITH DIFFERENTIAL/PLATELET
Basophils Absolute: 0 10*3/uL (ref 0–0.1)
Basophils Relative: 1 %
Eosinophils Absolute: 0.1 10*3/uL (ref 0–0.7)
Eosinophils Relative: 3 %
HCT: 35.3 % (ref 35.0–47.0)
HEMOGLOBIN: 11.6 g/dL — AB (ref 12.0–16.0)
LYMPHS ABS: 1.7 10*3/uL (ref 1.0–3.6)
LYMPHS PCT: 36 %
MCH: 26.9 pg (ref 26.0–34.0)
MCHC: 32.9 g/dL (ref 32.0–36.0)
MCV: 81.7 fL (ref 80.0–100.0)
Monocytes Absolute: 0.7 10*3/uL (ref 0.2–0.9)
Monocytes Relative: 15 %
NEUTROS ABS: 2.1 10*3/uL (ref 1.4–6.5)
NEUTROS PCT: 45 %
Platelets: 204 10*3/uL (ref 150–440)
RBC: 4.32 MIL/uL (ref 3.80–5.20)
RDW: 13.6 % (ref 11.5–14.5)
WBC: 4.6 10*3/uL (ref 3.6–11.0)

## 2016-06-12 LAB — COMPREHENSIVE METABOLIC PANEL
ALK PHOS: 140 U/L — AB (ref 38–126)
ALT: 15 U/L (ref 14–54)
AST: 26 U/L (ref 15–41)
Albumin: 3.7 g/dL (ref 3.5–5.0)
Anion gap: 7 (ref 5–15)
BUN: 23 mg/dL — ABNORMAL HIGH (ref 6–20)
CALCIUM: 10.2 mg/dL (ref 8.9–10.3)
CO2: 24 mmol/L (ref 22–32)
CREATININE: 1.29 mg/dL — AB (ref 0.44–1.00)
Chloride: 108 mmol/L (ref 101–111)
GFR, EST AFRICAN AMERICAN: 51 mL/min — AB (ref >60)
GFR, EST NON AFRICAN AMERICAN: 44 mL/min — AB (ref >60)
Glucose, Bld: 102 mg/dL — ABNORMAL HIGH (ref 65–99)
Potassium: 5.1 mmol/L (ref 3.5–5.1)
SODIUM: 139 mmol/L (ref 135–145)
Total Bilirubin: 0.7 mg/dL (ref 0.3–1.2)
Total Protein: 7.2 g/dL (ref 6.5–8.1)

## 2016-06-12 LAB — LIPASE, BLOOD: Lipase: 28 U/L (ref 11–51)

## 2016-06-12 MED ORDER — SODIUM CHLORIDE 0.9 % IV SOLN
INTRAVENOUS | Status: AC
  Administered 2016-06-12 – 2016-06-13 (×3): via INTRAVENOUS

## 2016-06-12 MED ORDER — PROMETHAZINE HCL 25 MG/ML IJ SOLN
25.0000 mg | Freq: Four times a day (QID) | INTRAMUSCULAR | Status: DC | PRN
  Filled 2016-06-12: qty 1

## 2016-06-12 MED ORDER — LORATADINE 10 MG PO TABS
10.0000 mg | ORAL_TABLET | Freq: Every day | ORAL | Status: DC
  Administered 2016-06-13: 10 mg via ORAL
  Filled 2016-06-12 (×2): qty 1

## 2016-06-12 MED ORDER — ATORVASTATIN CALCIUM 10 MG PO TABS: 10.0000 mg | ORAL_TABLET | Freq: Every day | ORAL | 0 refills | Status: DC

## 2016-06-12 MED ORDER — ACETAMINOPHEN 650 MG RE SUPP: 650.0000 mg | Freq: Four times a day (QID) | RECTAL | Status: DC | PRN

## 2016-06-12 MED ORDER — AMITRIPTYLINE HCL 50 MG PO TABS: 50.0000 mg | ORAL_TABLET | Freq: Every day | ORAL | 0 refills | Status: DC

## 2016-06-12 MED ORDER — PANTOPRAZOLE SODIUM 40 MG PO TBEC
40.0000 mg | DELAYED_RELEASE_TABLET | Freq: Two times a day (BID) | ORAL | Status: DC
  Administered 2016-06-12: 40 mg via ORAL
  Filled 2016-06-12: qty 1

## 2016-06-12 MED ORDER — PANTOPRAZOLE SODIUM 40 MG PO TBEC
40.0000 mg | DELAYED_RELEASE_TABLET | Freq: Every day | ORAL | Status: DC
  Administered 2016-06-12 – 2016-06-13 (×2): 40 mg via ORAL
  Filled 2016-06-12 (×2): qty 1

## 2016-06-12 MED ORDER — ERGOCALCIFEROL 1.25 MG (50000 UT) PO CAPS
50000.0000 [IU] | ORAL_CAPSULE | ORAL | Status: DC
  Filled 2016-06-12: qty 1

## 2016-06-12 MED ORDER — PROMETHAZINE HCL 25 MG/ML IJ SOLN
25.0000 mg | Freq: Once | INTRAMUSCULAR | Status: AC
  Administered 2016-06-12: 25 mg via INTRAMUSCULAR
  Filled 2016-06-12: qty 1

## 2016-06-12 MED ORDER — ENOXAPARIN SODIUM 40 MG/0.4ML ~~LOC~~ SOLN
40.0000 mg | SUBCUTANEOUS | Status: DC
  Administered 2016-06-12: 21:00:00 40 mg via SUBCUTANEOUS
  Filled 2016-06-12: qty 0.4

## 2016-06-12 MED ORDER — PROMETHAZINE HCL 25 MG PO TABS: 25.0000 mg | ORAL_TABLET | Freq: Four times a day (QID) | ORAL | Status: DC | PRN

## 2016-06-12 MED ORDER — FERROUS SULFATE 325 (65 FE) MG PO TABS
325.0000 mg | ORAL_TABLET | Freq: Every day | ORAL | Status: DC
  Administered 2016-06-13: 325 mg via ORAL
  Filled 2016-06-12 (×2): qty 1

## 2016-06-12 MED ORDER — DEXTROSE 5 % IV SOLN: 1.0000 g | INTRAVENOUS | Status: DC

## 2016-06-12 MED ORDER — AMITRIPTYLINE HCL 50 MG PO TABS: 50.0000 mg | ORAL_TABLET | Freq: Every day | ORAL | Status: DC

## 2016-06-12 MED ORDER — QUETIAPINE FUMARATE 25 MG PO TABS
200.0000 mg | ORAL_TABLET | Freq: Every day | ORAL | Status: DC
  Administered 2016-06-12: 21:00:00 200 mg via ORAL
  Filled 2016-06-12: qty 8

## 2016-06-12 MED ORDER — ACETAMINOPHEN 325 MG PO TABS
650.0000 mg | ORAL_TABLET | Freq: Four times a day (QID) | ORAL | Status: DC | PRN
  Administered 2016-06-12 – 2016-06-13 (×2): 650 mg via ORAL
  Filled 2016-06-12 (×2): qty 2

## 2016-06-12 MED ORDER — ONDANSETRON HCL 4 MG PO TABS
4.0000 mg | ORAL_TABLET | Freq: Four times a day (QID) | ORAL | Status: DC | PRN
  Administered 2016-06-12 – 2016-06-13 (×2): 4 mg via ORAL
  Filled 2016-06-12 (×2): qty 1

## 2016-06-12 MED ORDER — AMITRIPTYLINE HCL 50 MG PO TABS
50.0000 mg | ORAL_TABLET | Freq: Every day | ORAL | Status: DC
  Administered 2016-06-12: 21:00:00 50 mg via ORAL
  Filled 2016-06-12: qty 1

## 2016-06-12 MED ORDER — ONDANSETRON HCL 4 MG PO TABS
4.0000 mg | ORAL_TABLET | Freq: Three times a day (TID) | ORAL | Status: DC | PRN
  Administered 2016-06-12: 4 mg via ORAL
  Filled 2016-06-12: qty 1

## 2016-06-12 MED ORDER — LETROZOLE 2.5 MG PO TABS
2.5000 mg | ORAL_TABLET | Freq: Every day | ORAL | Status: DC
  Administered 2016-06-13: 2.5 mg via ORAL
  Filled 2016-06-12 (×2): qty 1

## 2016-06-12 MED ORDER — CEFTRIAXONE SODIUM-DEXTROSE 1-3.74 GM-% IV SOLR
1.0000 g | INTRAVENOUS | Status: DC
  Administered 2016-06-12: 18:00:00 1 g via INTRAVENOUS
  Filled 2016-06-12: qty 50

## 2016-06-12 MED ORDER — FLUOXETINE HCL 20 MG PO CAPS
40.0000 mg | ORAL_CAPSULE | Freq: Every day | ORAL | Status: DC
  Administered 2016-06-13: 40 mg via ORAL
  Filled 2016-06-12 (×2): qty 2

## 2016-06-12 MED ORDER — ONDANSETRON HCL 4 MG/2ML IJ SOLN: 4.0000 mg | Freq: Four times a day (QID) | INTRAMUSCULAR | Status: DC | PRN

## 2016-06-12 MED ORDER — ATORVASTATIN CALCIUM 20 MG PO TABS
10.0000 mg | ORAL_TABLET | Freq: Every day | ORAL | Status: DC
  Administered 2016-06-12: 10 mg via ORAL
  Filled 2016-06-12: qty 1

## 2016-06-12 MED ORDER — VITAMIN C 500 MG PO TABS
500.0000 mg | ORAL_TABLET | Freq: Every day | ORAL | Status: DC
  Administered 2016-06-13: 10:00:00 500 mg via ORAL
  Filled 2016-06-12 (×2): qty 1

## 2016-06-12 NOTE — BHH Group Notes (Signed)
[place] LCSW Group Therapy   06/12/2016 1pm Type of Therapy: Group Therapy   Participation Level: Pt invited but did not attend.  Participation Quality: Pt invited but did not attend.   Glorious Peach, MSW, LCSWA 06/12/2016, 1:55PM
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
## 2016-06-12 NOTE — Care Management (Signed)
April Pham was transferred from [facility], she is not feeling much better but said that she is doing okay now that her family is bedside. I still am concerned about her depression and having a conversation around the challenges with that, but will look to visit with her tomorrow at some point if possible.

## 2016-06-12 NOTE — Progress Notes (Signed)
Recreation Therapy Notes  Date: 12.04.17 Time: 9:30 am Location: Craft Room  Group Topic: Self-expression  Goal Area(s) Addresses:  Patient will identify one color per emotion listed on wheel. Patient will verbalize benefit of using art as a means of self-expression. Patient will verbalize one emotion experienced during session. Patient will be educated on other forms of self-expression.  Behavioral Response: Did not attend  Intervention: Emotion Wheel  Activity: Patients were given an Emotion Wheel worksheet and were instructed to pick a color for each emotion listed on the wheel.  Education: LRT educated patients on other forms of self-expression.  Education Outcome: Patient did not attend group.   Clinical Observations/Feedback: Patient did not attend group.  Leonette Monarch, LRT/CTRS 06/12/2016 10:07 AM

## 2016-06-12 NOTE — Progress Notes (Signed)
Provided and reviewed discharge paperwork and prescriptions. Verified understanding by use of teach back method. Pt to be transferred (discharge/readmitted) to medical floor. Awaiting room assignment.  Pt belongings returned as noted. Pt key for valuables sent to safe to be handed off to floor nurse for pt chart. Denies SI/HI/AVH at this time. Will call report once room is assigned. Safety maintained with every 15 minute checks. Will continue to monitor.

## 2016-06-12 NOTE — Progress Notes (Signed)
Hospitalist visited pt and recommends admission to the medical floor. Informed Dr. Jerilee Hoh.

## 2016-06-12 NOTE — Progress Notes (Signed)
Pt to be transferred (discharged/readmitted) to unit 1C room 125. Report called to Jinny Blossom, RN(1C), nurse verbalizes understanding. Will hand off security safe key to be placed on pt's chart when pt is transferred. Will transport pt via wheelchair. Safety maintained. Will continue to monitor.

## 2016-06-12 NOTE — H&P (Signed)
[facility] [place] NAME: April Pham    MR#:  [id number]  DATE OF BIRTH:  [birth year]  DATE OF ADMISSION:  06/12/2016  PRIMARY CARE PHYSICIAN: No PCP Per Patient   REQUESTING/REFERRING PHYSICIAN: Dr. Jerilee Hoh  CHIEF COMPLAINT:  Nausea and vomiting  HISTORY OF PRESENT ILLNESS:  April Pham  is a 60 y.o. female with a known history of GERD, breast cancer, hypertension, hyperlipidemia, major depression is admitted to [facility] for major depression and was about to be discharged today but her discharge was canceled as patient was having intractable nausea and vomiting associated with epigastric abdominal pain from last night. Patient has reported 7-8 episodes of vomiting with no blood. Reporting epigastric abdominal pain. Denies any diarrhea. Feeling weak and tired. Denies any dizziness or loss of consciousness.  PAST MEDICAL HISTORY:   Past Medical History:  Diagnosis Date  . Cancer ([place])   . Depression   . Hypertension     PAST SURGICAL HISTOIRY:   Past Surgical History:  Procedure Laterality Date  . ABDOMINAL HYSTERECTOMY    . APPENDECTOMY    . BREAST SURGERY      SOCIAL HISTORY:   Social History  Substance Use Topics  . Smoking status: Never Smoker  . Smokeless tobacco: Never Used  . Alcohol use No    FAMILY HISTORY:   Family History  Problem Relation Age of Onset  . Hypertension Daughter     DRUG ALLERGIES:   Allergies  Allergen Reactions  . Ibuprofen   . Nsaids     REVIEW OF SYSTEMS:  CONSTITUTIONAL: No fever, fatigue or weakness.  EYES: No blurred or double vision.  EARS, NOSE, AND THROAT: No tinnitus or ear pain.  RESPIRATORY: No cough, shortness of breath, wheezing or hemoptysis.  CARDIOVASCULAR: No chest pain, orthopnea, edema.  GASTROINTESTINAL: Has nausea, vomiting, denies diarrhea, reporting epigastric  abdominal pain.  GENITOURINARY: No dysuria, hematuria.  ENDOCRINE: No polyuria,  nocturia,  HEMATOLOGY: No anemia, easy bruising or bleeding SKIN: No rash or lesion. MUSCULOSKELETAL: No joint pain or arthritis.   NEUROLOGIC: No tingling, numbness, weakness.  PSYCHIATRY: No anxiety or depression.   MEDICATIONS AT HOME:   Prior to Admission medications   Medication Sig Start Date End Date Taking? Authorizing Provider  AMITIZA 24 MCG capsule Take 24 mcg by mouth 2 (two) times daily. 04/06/16   Historical Provider, MD  amitriptyline (ELAVIL) 50 MG tablet Take 1 tablet (50 mg total) by mouth at bedtime. 06/12/16   Hildred Priest, MD  atorvastatin (LIPITOR) 10 MG tablet Take 1 tablet (10 mg total) by mouth daily at 6 PM. 06/12/16   Hildred Priest, MD  cetirizine (ZYRTEC) 10 MG tablet Take 10 mg by mouth daily.    Historical Provider, MD  ergocalciferol (VITAMIN D2) 50000 units capsule Take 50,000 Units by mouth once a week.    Historical Provider, MD  ferrous sulfate 325 (65 FE) MG EC tablet Take 325 mg by mouth daily.    Historical Provider, MD  FLUoxetine (PROZAC) 40 MG capsule Take 40 mg by mouth daily.    Historical Provider, MD  letrozole (FEMARA) 2.5 MG tablet Take 2.5 mg by mouth daily.    Historical Provider, MD  omeprazole (PRILOSEC) 20 MG capsule Take 20 mg by mouth 2 (two) times daily before a meal.    Historical Provider, MD  QUEtiapine (SEROQUEL) 100 MG tablet Take 200 mg by mouth at bedtime. 03/07/16   Historical Provider, MD  vitamin  C (ASCORBIC ACID) 500 MG tablet Take 500 mg by mouth daily.    Historical Provider, MD      VITAL SIGNS:  Blood pressure (!) 114/49, pulse 80, temperature 97.9 F (36.6 C), temperature source Oral, resp. rate 18, SpO2 99 %.  PHYSICAL EXAMINATION:  GENERAL:  60 y.o.-year-old patient lying in the bed with no acute distress.  EYES: Pupils equal, round, reactive to light and accommodation. No scleral icterus. Extraocular muscles intact.  HEENT: Head atraumatic, normocephalic. Oropharynx and nasopharynx clear.  Dry mucous membranes NECK:  Supple, no jugular venous distention. No thyroid enlargement, no tenderness.  LUNGS: Normal breath sounds bilaterally, no wheezing, rales,rhonchi or crepitation. No use of accessory muscles of respiration.  CARDIOVASCULAR: S1, S2 normal. No murmurs, rubs, or gallops.  ABDOMEN: Soft, Epigastric tenderness is present but no rebound tenderness, nondistended. Bowel sounds present. No organomegaly or mass.  EXTREMITIES: No pedal edema, cyanosis, or clubbing.  NEUROLOGIC: Cranial nerves II through XII are intact. Muscle strength 5/5 in all extremities. Sensation intact. Gait not checked.  PSYCHIATRIC: The patient is alert and oriented x 3.  SKIN: No obvious rash, lesion, or ulcer.   LABORATORY PANEL:   CBC  Recent Labs Lab 06/12/16 1030  WBC 4.6  HGB 11.6*  HCT 35.3  PLT 204   ------------------------------------------------------------------------------------------------------------------  Chemistries   Recent Labs Lab 06/12/16 1030  NA 139  K 5.1  CL 108  CO2 24  GLUCOSE 102*  BUN 23*  CREATININE 1.29*  CALCIUM 10.2  AST 26  ALT 15  ALKPHOS 140*  BILITOT 0.7   ------------------------------------------------------------------------------------------------------------------  Cardiac Enzymes No results for input(s): TROPONINI in the last 168 hours. ------------------------------------------------------------------------------------------------------------------  RADIOLOGY:  No results found.  EKG:  No orders found for this or any previous visit.  IMPRESSION AND PLAN:   April Pham  is a 61 y.o. female with a known history of GERD, breast cancer, hypertension, hyperlipidemia, major depression is admitted to [facility] for major depression and was about to be discharged today but her discharge was canceled as patient was having intractable nausea and vomiting associated with epigastric abdominal pain from last night. Patient has reported 7-8  episodes of vomiting with no blood. Reporting epigastric abdominal pain  #Acute gastritis with intractable nausea and vomiting/GERD/ UTI Admit to MedSurg unit IV fluids PPI Supportive treatment and antiemetics If no improvement will consider abdominal ultrasound   #DysuriaAbnormal urinalysis UTI - Urine culture and sensitivity is ordered and patient is started on IV Rocephin  #Acute kidney injury from dehydration from nausea and vomiting Hydrate with IV fluids Avoid nephrotoxins and monitor renal function closely  #. Depression disorder Consults psychiatry and continue psych medications Prozac, Seroquel if the patient can't tolerate by mouth   #hypertension  blood pressure being soft holding blood pressure medicine  #Hyperlipidemia continue Lipitor her home medication  #History of breast cancer on chemotherapy continue femara      All the records are reviewed and case discussed with ED provider. Management plans discussed with the patient, family and they are in agreement.  CODE STATUS: FC,Daughter is the healthcare power of attorney  TOTAL TIME TAKING CARE OF THIS PATIENT: 42 minutes.   Note: This dictation was prepared with Dragon dictation along with smaller phrase technology. Any transcriptional errors that result from this process are unintentional.  Nicholes Mango M.D on 06/12/2016 at 9:52 PM  Between 7am to 6pm - Pager - [phone]  After 6pm go to [email or web address] - password [email or web address] [facility]  Office  [phone]  CC: Primary care physician; No PCP Per Patient

## 2016-06-12 NOTE — Progress Notes (Signed)
Pt awake, alert, in her bed this morning complaining of nausea with vomiting earlier this morning, poor sleep last night. Vomited small amount, clear this shift. Phenergan IM given as ordered, see MAR. No vomiting episodes since, reports some relief. Specimen cup available for urine specimen needed. Pt denies SI/HI/AVH. Appears flat, depressed, is not feeling well physically at all. Pt is focused on discharging today so that she can go to her neurologist appointment this afternoon, awaiting lab results. Pt medication compliant.  Support and encouragement provided with use of therapeutic communication. Medications administered as ordered with education. Safety maintained with every 15 minute checks. Will continue to monitor.
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
## 2016-06-12 NOTE — Consult Note (Signed)
[facility] Face-to-Face Psychiatry Consult   Reason for Consult:  Consult for 60 year old woman with a history of depression. Just transferred today from the psychiatry service to the medical service Referring Physician:  Gouru Patient Identification: April Pham MRN:  [id number] Principal Diagnosis: MDD (major depressive disorder) Diagnosis:   Patient Active Problem List   Diagnosis Date Noted  . 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 Acute gastritis without hemorrhage [K29.00] 06/12/2016  . Dyslipidemia [E78.5] 06/09/2016  . MDD (major depressive disorder) [F32.9] 06/08/2016  . Cerebrovascular disease [I67.9] 06/08/2016  . Headache (Cerebellar tonsillar ectopia) [R51] 05/24/2016  . Essential hypertension [I10] 05/24/2016  . Normochromic normocytic anemia [D64.9] 05/24/2016  . History of breast cancer [Z85.3] 05/24/2016    Total Time spent with patient: 1 hour  Subjective:   April Pham is a 60 y.o. female patient admitted with "I just was feeling so sick".  HPI:  Patient interviewed. Chart reviewed. Labs reviewed. This is a 60 year old woman who is being treated downstairs on the psychiatry ward. She became dizzy and somewhat confused and was feeling very sick and was thought to be dehydrated. Transferred upstairs to the medical service. She said that she had been feeling sick to her stomach and feeling like vomiting all weekend and has not been eating well. As far as her depression she says it is a [place] bit better than when she first came in. She had presented to the hospital with 2 weeks or more of depressed mood negative feelings decreased enjoyment in activities and some passive suicidal ideation. There've been some problems with compliance with medication. Patient also describes some stresses she was having at home in her interactions with her husband and with people at her church. Since being on the psychiatry ward medications have been changed slightly. She now  denies any suicidal or homicidal thought. Not having any psychotic symptoms. Still feeling slightly negative and a [place] nervous.  Social history: Patient is on disability. Lives with her husband. Has adult children.  Medical history: Currently being treated for gastritis with nausea and vomiting. History of dyslipidemia chronic headaches history of breast cancer  Substance abuse history: Denies any alcohol or drug abuse history  Past Psychiatric History: Patient has a history of depression. His had 1 previous suicide attempt as a adolescent. Has been [facility]. Feels that medications of been partially helpful for her but seems to be more positively inclined towards treatment at [facility] with therapy.  Risk to Self: Is patient at risk for suicide?: No Risk to Others:   Prior Inpatient Therapy:   Prior Outpatient Therapy:    Past Medical History:  Past Medical History:  Diagnosis Date  . Cancer ([place])   . Depression   . Hypertension     Past Surgical History:  Procedure Laterality Date  . ABDOMINAL HYSTERECTOMY    . APPENDECTOMY    . BREAST SURGERY     Family History:  Family History  Problem Relation Age of Onset  . Hypertension Daughter    Family Psychiatric  History: Positive for anxiety symptoms and some depression Social History:  History  Alcohol Use No     History  Drug Use No    Social History   Social History  . Marital status: Married    Spouse name: N/A  . Number of children: N/A  . Years of education: N/A   Social History Main Topics  . Smoking status: Never Smoker  . Smokeless tobacco: Never Used  .  Alcohol use No  . Drug use: No  . Sexual activity: Not Asked   Other Topics Concern  . None   Social History Narrative  . None   Additional Social History:    Allergies:   Allergies  Allergen Reactions  . Ibuprofen   . Nsaids     Labs:  Results for orders placed or performed during the hospital encounter of 06/08/16  (from the past 48 hour(s))  CBC with Differential/Platelet     Status: Abnormal   Collection Time: 06/12/16 10:30 AM  Result Value Ref Range   WBC 4.6 3.6 - 11.0 K/uL   RBC 4.32 3.80 - 5.20 MIL/uL   Hemoglobin 11.6 (L) 12.0 - 16.0 g/dL   HCT 35.3 35.0 - 47.0 %   MCV 81.7 80.0 - 100.0 fL   MCH 26.9 26.0 - 34.0 pg   MCHC 32.9 32.0 - 36.0 g/dL   RDW 13.6 11.5 - 14.5 %   Platelets 204 150 - 440 K/uL   Neutrophils Relative % 45 %   Neutro Abs 2.1 1.4 - 6.5 K/uL   Lymphocytes Relative 36 %   Lymphs Abs 1.7 1.0 - 3.6 K/uL   Monocytes Relative 15 %   Monocytes Absolute 0.7 0.2 - 0.9 K/uL   Eosinophils Relative 3 %   Eosinophils Absolute 0.1 0 - 0.7 K/uL   Basophils Relative 1 %   Basophils Absolute 0.0 0 - 0.1 K/uL  Comprehensive metabolic panel     Status: Abnormal   Collection Time: 06/12/16 10:30 AM  Result Value Ref Range   Sodium 139 135 - 145 mmol/L   Potassium 5.1 3.5 - 5.1 mmol/L   Chloride 108 101 - 111 mmol/L   CO2 24 22 - 32 mmol/L   Glucose, Bld 102 (H) 65 - 99 mg/dL   BUN 23 (H) 6 - 20 mg/dL   Creatinine, Ser 1.29 (H) 0.44 - 1.00 mg/dL   Calcium 10.2 8.9 - 10.3 mg/dL   Total Protein 7.2 6.5 - 8.1 g/dL   Albumin 3.7 3.5 - 5.0 g/dL   AST 26 15 - 41 U/L   ALT 15 14 - 54 U/L   Alkaline Phosphatase 140 (H) 38 - 126 U/L   Total Bilirubin 0.7 0.3 - 1.2 mg/dL   GFR calc non Af Amer 44 (L) >60 mL/min   GFR calc Af Amer 51 (L) >60 mL/min    Comment: (NOTE) The eGFR has been calculated using the CKD EPI equation. This calculation has not been validated in all clinical situations. eGFR's persistently <60 mL/min signify possible Chronic Kidney Disease.    Anion gap 7 5 - 15  Lipase, blood     Status: None   Collection Time: 06/12/16 10:30 AM  Result Value Ref Range   Lipase 28 11 - 51 U/L  Urinalysis complete, with microscopic ([facility] only)     Status: Abnormal   Collection Time: 06/12/16 12:11 PM  Result Value Ref Range   Color, Urine YELLOW (A) YELLOW   APPearance  CLEAR (A) CLEAR   Glucose, UA NEGATIVE NEGATIVE mg/dL   Bilirubin Urine NEGATIVE NEGATIVE   Ketones, ur NEGATIVE NEGATIVE mg/dL   Specific Gravity, Urine 1.013 1.005 - 1.030   Hgb urine dipstick NEGATIVE NEGATIVE   pH 6.0 5.0 - 8.0   Protein, ur NEGATIVE NEGATIVE mg/dL   Nitrite NEGATIVE NEGATIVE   Leukocytes, UA 1+ (A) NEGATIVE   RBC / HPF 0-5 0 - 5 RBC/hpf   WBC, UA 0-5 0 -  5 WBC/hpf   Bacteria, UA NONE SEEN NONE SEEN   Squamous Epithelial / LPF 0-5 (A) NONE SEEN   Mucous PRESENT     Current Facility-Administered Medications  Medication Dose Route Frequency Provider Last Rate Last Dose  . 0.9 %  sodium chloride infusion   Intravenous Continuous Nicholes Mango, MD 125 mL/hr at 06/12/16 1810    . acetaminophen (TYLENOL) tablet 650 mg  650 mg Oral Q6H PRN Nicholes Mango, MD   650 mg at 06/12/16 1810   Or  . acetaminophen (TYLENOL) suppository 650 mg  650 mg Rectal Q6H PRN Nicholes Mango, MD      . amitriptyline (ELAVIL) tablet 50 mg  50 mg Oral QHS Aruna Gouru, MD      . atorvastatin (LIPITOR) tablet 10 mg  10 mg Oral q1800 Nicholes Mango, MD   10 mg at 06/12/16 1810  . cefTRIAXone (ROCEPHIN) IVPB 1 g  1 g Intravenous Q24H Nicholes Mango, MD   1 g at 06/12/16 1810  . enoxaparin (LOVENOX) injection 40 mg  40 mg Subcutaneous Q24H Aruna Gouru, MD      . ergocalciferol (VITAMIN D2) capsule 50,000 Units  50,000 Units Oral Weekly Aruna Gouru, MD      . ferrous sulfate tablet 325 mg  325 mg Oral Daily Aruna Gouru, MD      . FLUoxetine (PROZAC) capsule 40 mg  40 mg Oral Daily Aruna Gouru, MD      . letrozole [facility]) tablet 2.5 mg  2.5 mg Oral Daily Aruna Gouru, MD      . loratadine (CLARITIN) tablet 10 mg  10 mg Oral Daily Aruna Gouru, MD      . ondansetron (ZOFRAN) tablet 4 mg  4 mg Oral Q6H PRN Nicholes Mango, MD   4 mg at 06/12/16 1810   Or  . ondansetron (ZOFRAN) injection 4 mg  4 mg Intravenous Q6H PRN Aruna Gouru, MD      . pantoprazole (PROTONIX) EC tablet 40 mg  40 mg Oral Daily Nicholes Mango, MD   40  mg at 06/12/16 1810  . QUEtiapine (SEROQUEL) tablet 200 mg  200 mg Oral QHS Aruna Gouru, MD      . vitamin C (ASCORBIC ACID) tablet 500 mg  500 mg Oral Daily Nicholes Mango, MD        Musculoskeletal: Strength & Muscle Tone: decreased Gait & Station: unsteady Patient leans: N/A  Psychiatric Specialty Exam: Physical Exam  Nursing note and vitals reviewed. Constitutional: She appears well-developed and well-nourished.  HENT:  Head: Normocephalic and atraumatic.  Eyes: Conjunctivae are normal. Pupils are equal, round, and reactive to light.  Neck: Normal range of motion.  Cardiovascular: Regular rhythm and normal heart sounds.   Respiratory: Effort normal. No respiratory distress.  GI: Soft. There is tenderness.  Musculoskeletal: Normal range of motion.  Neurological: She is alert.  Skin: Skin is warm and dry.  Psychiatric: Judgment normal. Her speech is delayed. She is slowed. Cognition and memory are normal. She exhibits a depressed mood. She expresses no suicidal ideation.    Review of Systems  Constitutional: Negative.   HENT: Negative.   Eyes: Negative.   Respiratory: Negative.   Cardiovascular: Negative.   Gastrointestinal: Positive for nausea and vomiting.  Musculoskeletal: Negative.   Skin: Negative.   Neurological: Negative.   Psychiatric/Behavioral: Positive for depression. Negative for hallucinations, substance abuse and suicidal ideas. The patient is nervous/anxious and has insomnia.     There were no vitals taken for this visit.There  is no height or weight on file to calculate BMI.  General Appearance: Casual  Eye Contact:  Minimal  Speech:  Slow  Volume:  Decreased  Mood:  Dysphoric  Affect:  Congruent  Thought Process:  Goal Directed  Orientation:  Full (Time, Place, and Person)  Thought Content:  Logical  Suicidal Thoughts:  No  Homicidal Thoughts:  No  Memory:  Immediate;   Good Recent;   Fair Remote;   Fair  Judgement:  Fair  Insight:  Fair   Psychomotor Activity:  Decreased  Concentration:  Concentration: Fair  Recall:  [employer] of Knowledge:  Fair  Language:  Fair  Akathisia:  No  Handed:  Right  AIMS (if indicated):     Assets:  Financial Resources/Insurance Housing Social Support  ADL's:  Intact  Cognition:  WNL  Sleep:        Treatment Plan Summary: Daily contact with patient to assess and evaluate symptoms and progress in treatment, Medication management and Plan Patient was started on Abilify downstairs and seems to be tolerating it adequately. Mood is improved. She will be followed up outpatient at [facility]. I am not going to make any medication changes right now. I will follow-up tomorrow to see how she is doing once she gets stabilized and get some rest overnight.  Disposition: Patient does not meet criteria for psychiatric inpatient admission. Supportive therapy provided about ongoing stressors.  Alethia Berthold, MD 06/12/2016 6:34 PM

## 2016-06-12 NOTE — Progress Notes (Signed)
I spoke with the patient's daughter today. I gave her an update on her mother's condition. I also contacted [place] [facility] to try to reschedule the patient's appointment. She had an appointment today at 2:00. I also contacted the hospitalist services as patient has been vomiting and is now complaining of abdominal pain. Labs were ordered CBC, compressive metabolic panel, lipase and UA.

## 2016-06-12 NOTE — Progress Notes (Signed)
Pt denies SI/HI/AVH. C/o N&V throughout night. Emesis x 3. Pt states that she feels it is due to "Acid Reflux". VSS.  Orders received for Protonix and Zofran @0520 . Voices no additional concerns at this time. Will continue to monitor.

## 2016-06-12 NOTE — Progress Notes (Signed)
Anticoagulation monitoring(Lovenox):  60 yo  Female  ordered Lovenox 30 mg Q24h  There were no vitals filed for this visit. BMI    Lab Results  Component Value Date   CREATININE 1.29 (H) 06/12/2016   CREATININE 1.04 (H) 05/25/2016   CREATININE 1.25 (H) 05/24/2016   Estimated Creatinine Clearance: 50.7 mL/min (by C-G formula based on SCr of 1.29 mg/dL (H)). Hemoglobin & Hematocrit     Component Value Date/Time   HGB 11.6 (L) 06/12/2016 1030   HCT 35.3 06/12/2016 1030     Per Protocol for Patient with estCrcl > 30 ml/min and BMI < 40, will transition to Lovenox 40 mg Q24h.

## 2016-06-12 NOTE — Progress Notes (Signed)
Pt assisted up to bathroom. Voided clear, yellow urine, specimen sent to lab. Pt very weak, unsteady. Pt continues to report she has not vomited since phenergan Im. Pt requests lunch tray be brought to her room. Lunch tray provided. Fluids provided and encouraged. Safety maintained. Will continue to monitor.

## 2016-06-12 NOTE — Progress Notes (Signed)
Pt discharge canceled. Pt belongings returned to pt specific locker and valuables sent to security safe.
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
## 2016-06-13 ENCOUNTER — Observation Stay: Payer: Medicaid Other

## 2016-06-13 DIAGNOSIS — K2900 Acute gastritis without bleeding: Secondary | ICD-10-CM | POA: Diagnosis not present

## 2016-06-13 LAB — BASIC METABOLIC PANEL
ANION GAP: 7 (ref 5–15)
BUN: 24 mg/dL — ABNORMAL HIGH (ref 6–20)
CHLORIDE: 106 mmol/L (ref 101–111)
CO2: 25 mmol/L (ref 22–32)
CREATININE: 1.17 mg/dL — AB (ref 0.44–1.00)
Calcium: 9.2 mg/dL (ref 8.9–10.3)
GFR calc non Af Amer: 49 mL/min — ABNORMAL LOW (ref >60)
GFR, EST AFRICAN AMERICAN: 57 mL/min — AB (ref >60)
Glucose, Bld: 97 mg/dL (ref 65–99)
POTASSIUM: 4.5 mmol/L (ref 3.5–5.1)
SODIUM: 138 mmol/L (ref 135–145)

## 2016-06-13 LAB — TSH: TSH: 0.832 u[IU]/mL (ref 0.350–4.500)

## 2016-06-13 MED ORDER — ONDANSETRON 4 MG PO TBDP: 4.0000 mg | ORAL_TABLET | Freq: Three times a day (TID) | ORAL | 0 refills | Status: DC | PRN

## 2016-06-13 MED ORDER — ENSURE ENLIVE PO LIQD
237.0000 mL | Freq: Two times a day (BID) | ORAL | Status: DC
  Administered 2016-06-13: 14:00:00 237 mL via ORAL

## 2016-06-13 NOTE — Progress Notes (Signed)
Patient c/o nausea. PRN Zofran given as ordered. Ate 100% of breakfast.

## 2016-06-13 NOTE — [facility] Suicide Risk Assessment (Signed)
[place] INPATIENT:  Family/Significant Other Suicide Prevention Education  Suicide Prevention Education:  Patient Refusal for Family/Significant Other Suicide Prevention Education: The patient April Pham has refused to provide written consent for family/significant other to be provided Family/Significant Other Suicide Prevention Education during admission and/or prior to discharge.  Physician notified.    Alphonse Guild April Pham *Late Entry 06/09/2016, 12:51 PM

## 2016-06-13 NOTE — Progress Notes (Signed)
Patient lives at home with a friend but states she is at home alone more than with anyone. Still drives. Uses a walker to ambulate at home.

## 2016-06-13 NOTE — Progress Notes (Signed)
Initial Nutrition Assessment  DOCUMENTATION CODES:   Obesity unspecified  INTERVENTION:  1. Ensure Enlive po BID, each supplement provides 350 kcal and 20 grams of protein 2. Monitor and Encourage PO intake. As long as her nausea is under control her PO intake should be good.  NUTRITION DIAGNOSIS:   Inadequate oral intake related to poor appetite as evidenced by per patient/family report.  GOAL:   Patient will meet greater than or equal to 90% of their needs  MONITOR:   PO intake, I & O's, Labs, Weight trends, Supplement acceptance  REASON FOR ASSESSMENT:   Malnutrition Screening Tool    ASSESSMENT:   April Pham  is a 60 y.o. female with a known history of GERD, breast cancer, hypertension, hyperlipidemia, major depression is admitted to [facility] for major depression and was about to be discharged today but her discharge was canceled as patient was having intractable nausea and vomiting associated with epigastric abdominal pain from last night  Spoke with [name] at bedside. She is a pleasant lady who unfortunately exhibits intractable nausea/vomiting. SHe vomiting has decreased, still experiencing nausea on PRN Zofran at this time. Reports a good appetite. Ate 100% of her breakfast this morning - [place] toast, [place] sausage, scrambled eggs, and a fruit cup. She claims to have a stable weight of 192# - no current wt available. Reports a 10# wt loss 3 weeks ago. Continues to appear weak and dehydrated.  She also states "My Doctor took me off Pasta, bread, sweets for my cholesterol."  Nutrition-Focused physical exam completed. Findings are no fat depletion, no muscle depletion, and no edema.   Labs and medications reviewed: Vitamin D, Vitamin C, Iron NS @ 121mL/hr  Diet Order:  Diet Heart Room service appropriate? Yes; Fluid consistency: Thin  Skin:  Reviewed, no issues  Last BM:  06/10/2016  Height:   Ht Readings from Last 1 Encounters:  05/24/16 5\' 6"  (1.676  m)    Weight:   Wt Readings from Last 1 Encounters:  05/24/16 194 lb 10.7 oz (88.3 kg)    Ideal Body Weight:  59.09 kg  BMI:  There is no height or weight on file to calculate BMI.  Estimated Nutritional Needs:   Kcal:  1600-1800 calories  Protein:  86-103 gm  Fluid:  >/= 1.6L  EDUCATION NEEDS:   No education needs identified at this time  April Pham. [name], MS, RD LDN Inpatient Clinical Dietitian Pager [phone]

## 2016-06-13 NOTE — Progress Notes (Signed)
Patient's jewelry returned to patient from security.

## 2016-06-13 NOTE — Tx Team (Signed)
Interdisciplinary Treatment and Diagnostic Plan Update  06/13/2016 Time of Session: 10:30am April Pham MRN: [id number]  Principal Diagnosis: MDD (major depressive disorder)  Secondary Diagnoses: Principal Problem:   MDD (major depressive disorder) Active Problems:   Headache (Cerebellar tonsillar ectopia)   Essential hypertension   Normochromic normocytic anemia   History of breast cancer   Cerebrovascular disease   Dyslipidemia   Cerebellar tonsillar ectopia (HCC)   Current Medications:  No current facility-administered medications for this encounter.    No current outpatient prescriptions on file.   Facility-Administered Medications Ordered in Other Encounters  Medication Dose Route Frequency Provider Last Rate Last Dose  . 0.9 %  sodium chloride infusion   Intravenous Continuous Nicholes Mango, MD 125 mL/hr at 06/13/16 1011    . acetaminophen (TYLENOL) tablet 650 mg  650 mg Oral Q6H PRN Nicholes Mango, MD   650 mg at 06/12/16 1810   Or  . acetaminophen (TYLENOL) suppository 650 mg  650 mg Rectal Q6H PRN Nicholes Mango, MD      . amitriptyline (ELAVIL) tablet 50 mg  50 mg Oral QHS Nicholes Mango, MD   50 mg at 06/12/16 2045  . atorvastatin (LIPITOR) tablet 10 mg  10 mg Oral q1800 Nicholes Mango, MD   10 mg at 06/12/16 1810  . enoxaparin (LOVENOX) injection 40 mg  40 mg Subcutaneous Q24H Nicholes Mango, MD   40 mg at 06/12/16 2046  . ergocalciferol (VITAMIN D2) capsule 50,000 Units  50,000 Units Oral Weekly Aruna Gouru, MD      . feeding supplement (ENSURE ENLIVE) (ENSURE ENLIVE) liquid 237 mL  237 mL Oral BID BM April Lighter, MD      . ferrous sulfate tablet 325 mg  325 mg Oral Daily Nicholes Mango, MD   325 mg at 06/13/16 1012  . FLUoxetine (PROZAC) capsule 40 mg  40 mg Oral Daily Nicholes Mango, MD   40 mg at 06/13/16 1012  . letrozole [facility]) tablet 2.5 mg  2.5 mg Oral Daily Nicholes Mango, MD   2.5 mg at 06/13/16 1012  . loratadine (CLARITIN) tablet 10 mg  10 mg Oral Daily Nicholes Mango, MD    10 mg at 06/13/16 1012  . ondansetron (ZOFRAN) tablet 4 mg  4 mg Oral Q6H PRN Nicholes Mango, MD   4 mg at 06/13/16 1020   Or  . ondansetron (ZOFRAN) injection 4 mg  4 mg Intravenous Q6H PRN Aruna Gouru, MD      . pantoprazole (PROTONIX) EC tablet 40 mg  40 mg Oral Daily Nicholes Mango, MD   40 mg at 06/13/16 1012  . QUEtiapine (SEROQUEL) tablet 200 mg  200 mg Oral QHS Nicholes Mango, MD   200 mg at 06/12/16 2045  . vitamin C (ASCORBIC ACID) tablet 500 mg  500 mg Oral Daily Nicholes Mango, MD   500 mg at 06/13/16 1012   PTA Medications: Prescriptions Prior to Admission  Medication Sig Dispense Refill Last Dose  . AMITIZA 24 MCG capsule Take 24 mcg by mouth 2 (two) times daily.  6 05/23/2016 at AM dose  . amitriptyline (ELAVIL) 50 MG tablet Take 1 tablet (50 mg total) by mouth at bedtime. 30 tablet 0   . atorvastatin (LIPITOR) 10 MG tablet Take 1 tablet (10 mg total) by mouth daily at 6 PM. 30 tablet 0   . cetirizine (ZYRTEC) 10 MG tablet Take 10 mg by mouth daily.   05/23/2016 at Unknown time  . ergocalciferol (VITAMIN D2) 50000 units capsule Take 50,000  Units by mouth once a week.   Unknown at Unknown  . ferrous sulfate 325 (65 FE) MG EC tablet Take 325 mg by mouth daily.   05/23/2016 at Unknown time  . FLUoxetine (PROZAC) 40 MG capsule Take 40 mg by mouth daily.   05/23/2016 at Unknown time  . letrozole (FEMARA) 2.5 MG tablet Take 2.5 mg by mouth daily.   Past Week at Unknown time  . omeprazole (PRILOSEC) 20 MG capsule Take 20 mg by mouth 2 (two) times daily before a meal.   05/23/2016 at Unknown time  . QUEtiapine (SEROQUEL) 100 MG tablet Take 200 mg by mouth at bedtime.  5 Past Week at Unknown time  . vitamin C (ASCORBIC ACID) 500 MG tablet Take 500 mg by mouth daily.   05/23/2016 at Unknown time    Patient Stressors: Health problems Occupational concerns  Patient Strengths: Ability for insight Active sense of humor Average or above average intelligence Capable of independent  living Communication skills Supportive family/friends  Treatment Modalities: Medication Management, Group therapy, Case management,  1 to 1 session with clinician, Psychoeducation, Recreational therapy.   Physician Treatment Plan for Primary Diagnosis: MDD (major depressive disorder) Long Term Goal(s): Improvement in symptoms so as ready for discharge Improvement in symptoms so as ready for discharge   Short Term Goals: Ability to identify changes in lifestyle to reduce recurrence of condition will improve Ability to verbalize feelings will improve Ability to disclose and discuss suicidal ideas Ability to demonstrate self-control will improve Ability to identify and develop effective coping behaviors will improve Ability to identify triggers associated with substance abuse/mental health issues will improve Ability to identify changes in lifestyle to reduce recurrence of condition will improve Ability to identify and develop effective coping behaviors will improve Ability to identify triggers associated with substance abuse/mental health issues will improve  Medication Management: Evaluate patient's response, side effects, and tolerance of medication regimen.  Therapeutic Interventions: 1 to 1 sessions, Unit Group sessions and Medication administration.  Evaluation of Outcomes: Adequate for discharge  Physician Treatment Plan for Secondary Diagnosis: Principal Problem:   MDD (major depressive disorder) Active Problems:   Headache (Cerebellar tonsillar ectopia)   Essential hypertension   Normochromic normocytic anemia   History of breast cancer   Cerebrovascular disease   Dyslipidemia   Cerebellar tonsillar ectopia (HCC)  Long Term Goal(s): Improvement in symptoms so as ready for discharge Improvement in symptoms so as ready for discharge   Short Term Goals: Ability to identify changes in lifestyle to reduce recurrence of condition will improve Ability to verbalize feelings  will improve Ability to disclose and discuss suicidal ideas Ability to demonstrate self-control will improve Ability to identify and develop effective coping behaviors will improve Ability to identify triggers associated with substance abuse/mental health issues will improve Ability to identify changes in lifestyle to reduce recurrence of condition will improve Ability to identify and develop effective coping behaviors will improve Ability to identify triggers associated with substance abuse/mental health issues will improve     Medication Management: Evaluate patient's response, side effects, and tolerance of medication regimen.  Therapeutic Interventions: 1 to 1 sessions, Unit Group sessions and Medication administration.  Evaluation of Outcomes: Adequate for discharge    RN Treatment Plan for Primary Diagnosis: MDD (major depressive disorder) Long Term Goal(s): Knowledge of disease and therapeutic regimen to maintain health will improve  Short Term Goals: Ability to verbalize feelings will improve and Ability to disclose and discuss suicidal ideas  Medication Management: RN  will administer medications as ordered by provider, will assess and evaluate patient's response and provide education to patient for prescribed medication. RN will report any adverse and/or side effects to prescribing provider.  Therapeutic Interventions: 1 on 1 counseling sessions, Psychoeducation, Medication administration, Evaluate responses to treatment, Monitor vital signs and CBGs as ordered, Perform/monitor CIWA, COWS, AIMS and Fall Risk screenings as ordered, Perform wound care treatments as ordered.  Evaluation of Outcomes: Adequate for discharge   LCSW Treatment Plan for Primary Diagnosis: MDD (major depressive disorder) Long Term Goal(s): Safe transition to appropriate next level of care at discharge, Engage patient in therapeutic group addressing interpersonal concerns.  Short Term Goals: Engage  patient in aftercare planning with referrals and resources, Increase social support, Increase ability to appropriately verbalize feelings, Increase emotional regulation, Facilitate acceptance of mental health diagnosis and concerns and Increase skills for wellness and recovery  Therapeutic Interventions: Assess for all discharge needs, 1 to 1 time with Social worker, Explore available resources and support systems, Assess for adequacy in community support network, Educate family and significant other(s) on suicide prevention, Complete Psychosocial Assessment, Interpersonal group therapy.  Evaluation of Outcomes: Adequate for discharge g  Progress in Treatment: Attending groups: Yes. Participating in groups: Yes. Taking medication as prescribed: Yes. Toleration medication: Yes. Family/Significant other contact made: No, will contact:  pt's husband Jendaya Marchenko at ph: [phone] Patient understands diagnosis: Yes. Discussing patient identified problems/goals with staff: Yes. Medical problems stabilized or resolved: Yes. Denies suicidal/homicidal ideation: Yes. Issues/concerns per patient self-inventory: Yes. Other: none   New problem(s) identified: No, Describe:  none listed  New Short Term/Long Term Goal(s):  Discharge Plan or Barriers: Pt will discharge home to [place] to live with the pt's husband and will follow up with [facility] for medication management and therapy.    Reason for Continuation of Hospitalization: Anxiety Delusions  Depression Hallucinations Medical Issues Medication stabilization Suicidal ideation  Estimated Length of Stay:  Attendees: Patient: April Pham 06/09/2016 10:59 AM  Physician: Dr. Jerilee Hoh, MD  06/09/2016 10:59 AM  Nursing: Floyde Parkins, RN 06/09/2016 10:59 AM  RN Care Manager: 06/09/2016 10:59 AM  Social Worker: Alphonse Guild. Daine Gravel, LCAS   06/09/2016 10:59 AM  Recreational Therapist: Everitt Amber, LRT 06/09/2016 10:59 AM  Other:   06/09/2016 10:59 AM  Other:  06/09/2016 10:59 AM  Other: 06/09/2016 10:59 AM    Scribe for Treatment Team: Alphonse Guild Domenica Weightman, LCSWA  Update by Alphonse Guild. Vanderbilt Ranieri, LCSWA, LCAS  06/13/16 *Late Entry

## 2016-06-13 NOTE — Progress Notes (Signed)
Discharge paperwork reviewed with patient who verbalized understanding. Prescription for Zofran attached to paperwork. Patient's friend to transport home.

## 2016-06-13 NOTE — Consult Note (Signed)
[facility] Face-to-Face Psychiatry Consult   Reason for Consult:  Consult for 60 year old woman with a history of depression. Just transferred today from the psychiatry service to the medical service Referring Physician:  Gouru Patient Identification: April Pham MRN:  [id number] Principal Diagnosis: MDD (major depressive disorder) Diagnosis:   Patient Active Problem List   Diagnosis Date Noted  . Cerebellar tonsillar ectopia ([place]) [Q04.8] 06/12/2016  . Acute gastritis without hemorrhage [K29.00] 06/12/2016  . Dyslipidemia [E78.5] 06/09/2016  . MDD (major depressive disorder) [F32.9] 06/08/2016  . Cerebrovascular disease [I67.9] 06/08/2016  . Headache (Cerebellar tonsillar ectopia) [R51] 05/24/2016  . Essential hypertension [I10] 05/24/2016  . Normochromic normocytic anemia [D64.9] 05/24/2016  . History of breast cancer [Z85.3] 05/24/2016    Total Time spent with patient: 20 minutes  Subjective:   April Pham is a 60 y.o. female patient admitted with "I just was feeling so sick".  Follow-up for 60 year old patient with abdominal complaints and depression. Her nausea and vomiting are much better today. Mood is also improved. Patient was smiling and appeared upbeat. Denied any suicidal thoughts at all. Denied any psychosis. Patient is happy to be going home and will be following up with her local mental health provider.  HPI:  Patient interviewed. Chart reviewed. Labs reviewed. This is a 60 year old woman who is being treated downstairs on the psychiatry ward. She became dizzy and somewhat confused and was feeling very sick and was thought to be dehydrated. Transferred upstairs to the medical service. She said that she had been feeling sick to her stomach and feeling like vomiting all weekend and has not been eating well. As far as her depression she says it is a Stith bit better than when she first came in. She had presented to the hospital with 2 weeks or more of depressed mood negative  feelings decreased enjoyment in activities and some passive suicidal ideation. There've been some problems with compliance with medication. Patient also describes some stresses she was having at home in her interactions with her husband and with people at her church. Since being on the psychiatry ward medications have been changed slightly. She now denies any suicidal or homicidal thought. Not having any psychotic symptoms. Still feeling slightly negative and a [place] nervous.  Social history: Patient is on disability. Lives with her husband. Has adult children.  Medical history: Currently being treated for gastritis with nausea and vomiting. History of dyslipidemia chronic headaches history of breast cancer  Substance abuse history: Denies any alcohol or drug abuse history  Past Psychiatric History: Patient has a history of depression. His had 1 previous suicide attempt as a adolescent. [facility]. Feels that medications of been partially helpful for her but seems to be more positively inclined towards treatment at [facility] with therapy.  Risk to Self: Is patient at risk for suicide?: No Risk to Others:   Prior Inpatient Therapy:   Prior Outpatient Therapy:    Past Medical History:  Past Medical History:  Diagnosis Date  . Cancer ([place])   . Depression   . Hypertension     Past Surgical History:  Procedure Laterality Date  . ABDOMINAL HYSTERECTOMY    . APPENDECTOMY    . BREAST SURGERY     Family History:  Family History  Problem Relation Age of Onset  . Hypertension Daughter    Family Psychiatric  History: Positive for anxiety symptoms and some depression Social History:  History  Alcohol Use No     History  Drug Use No    Social History   Social History  . Marital status: Married    Spouse name: N/A  . Number of children: N/A  . Years of education: N/A   Social History Main Topics  . Smoking status: Never Smoker  . Smokeless tobacco:  Never Used  . Alcohol use No  . Drug use: No  . Sexual activity: Not Asked   Other Topics Concern  . None   Social History Narrative  . None   Additional Social History:    Allergies:   Allergies  Allergen Reactions  . Ibuprofen   . Nsaids     Labs:  Results for orders placed or performed during the hospital encounter of 06/12/16 (from the past 48 hour(s))  Urinalysis complete, with microscopic ([facility] only)     Status: Abnormal   Collection Time: 06/12/16  6:43 PM  Result Value Ref Range   Color, Urine STRAW (A) YELLOW   APPearance CLEAR (A) CLEAR   Glucose, UA NEGATIVE NEGATIVE mg/dL   Bilirubin Urine NEGATIVE NEGATIVE   Ketones, ur NEGATIVE NEGATIVE mg/dL   Specific Gravity, Urine 1.013 1.005 - 1.030   Hgb urine dipstick NEGATIVE NEGATIVE   pH 6.0 5.0 - 8.0   Protein, ur NEGATIVE NEGATIVE mg/dL   Nitrite NEGATIVE NEGATIVE   Leukocytes, UA TRACE (A) NEGATIVE   RBC / HPF NONE SEEN 0 - 5 RBC/hpf   WBC, UA 0-5 0 - 5 WBC/hpf   Bacteria, UA NONE SEEN NONE SEEN   Squamous Epithelial / LPF 0-5 (A) NONE SEEN   Mucous PRESENT   TSH     Status: None   Collection Time: 06/13/16  4:24 AM  Result Value Ref Range   TSH 0.832 0.350 - 4.500 uIU/mL    Comment: Performed by a 3rd Generation assay with a functional sensitivity of <=0.01 uIU/mL.  Basic metabolic panel     Status: Abnormal   Collection Time: 06/13/16  4:24 AM  Result Value Ref Range   Sodium 138 135 - 145 mmol/L   Potassium 4.5 3.5 - 5.1 mmol/L   Chloride 106 101 - 111 mmol/L   CO2 25 22 - 32 mmol/L   Glucose, Bld 97 65 - 99 mg/dL   BUN 24 (H) 6 - 20 mg/dL   Creatinine, Ser 1.17 (H) 0.44 - 1.00 mg/dL   Calcium 9.2 8.9 - 10.3 mg/dL   GFR calc non Af Amer 49 (L) >60 mL/min   GFR calc Af Amer 57 (L) >60 mL/min    Comment: (NOTE) The eGFR has been calculated using the CKD EPI equation. This calculation has not been validated in all clinical situations. eGFR's persistently <60 mL/min signify possible Chronic  Kidney Disease.    Anion gap 7 5 - 15    Current Facility-Administered Medications  Medication Dose Route Frequency Provider Last Rate Last Dose  . acetaminophen (TYLENOL) tablet 650 mg  650 mg Oral Q6H PRN Nicholes Mango, MD   650 mg at 06/13/16 1613   Or  . acetaminophen (TYLENOL) suppository 650 mg  650 mg Rectal Q6H PRN Nicholes Mango, MD      . amitriptyline (ELAVIL) tablet 50 mg  50 mg Oral QHS Nicholes Mango, MD   50 mg at 06/12/16 2045  . atorvastatin (LIPITOR) tablet 10 mg  10 mg Oral q1800 Nicholes Mango, MD   10 mg at 06/12/16 1810  . enoxaparin (LOVENOX) injection 40 mg  40 mg Subcutaneous Q24H Nicholes Mango, MD  40 mg at 06/12/16 2046  . ergocalciferol (VITAMIN D2) capsule 50,000 Units  50,000 Units Oral Weekly Aruna Gouru, MD      . feeding supplement (ENSURE ENLIVE) (ENSURE ENLIVE) liquid 237 mL  237 mL Oral BID BM Gladstone Lighter, MD   237 mL at 06/13/16 1400  . ferrous sulfate tablet 325 mg  325 mg Oral Daily Nicholes Mango, MD   325 mg at 06/13/16 1012  . FLUoxetine (PROZAC) capsule 40 mg  40 mg Oral Daily Nicholes Mango, MD   40 mg at 06/13/16 1012  . letrozole [facility]) tablet 2.5 mg  2.5 mg Oral Daily Nicholes Mango, MD   2.5 mg at 06/13/16 1012  . loratadine (CLARITIN) tablet 10 mg  10 mg Oral Daily Nicholes Mango, MD   10 mg at 06/13/16 1012  . ondansetron (ZOFRAN) tablet 4 mg  4 mg Oral Q6H PRN Nicholes Mango, MD   4 mg at 06/13/16 1020   Or  . ondansetron (ZOFRAN) injection 4 mg  4 mg Intravenous Q6H PRN Aruna Gouru, MD      . pantoprazole (PROTONIX) EC tablet 40 mg  40 mg Oral Daily Nicholes Mango, MD   40 mg at 06/13/16 1012  . QUEtiapine (SEROQUEL) tablet 200 mg  200 mg Oral QHS Nicholes Mango, MD   200 mg at 06/12/16 2045  . vitamin C (ASCORBIC ACID) tablet 500 mg  500 mg Oral Daily Nicholes Mango, MD   500 mg at 06/13/16 1012    Musculoskeletal: Strength & Muscle Tone: decreased Gait & Station: unsteady Patient leans: N/A  Psychiatric Specialty Exam: Physical Exam  Nursing note and  vitals reviewed. Constitutional: She appears well-developed and well-nourished.  HENT:  Head: Normocephalic and atraumatic.  Eyes: Conjunctivae are normal. Pupils are equal, round, and reactive to light.  Neck: Normal range of motion.  Cardiovascular: Regular rhythm and normal heart sounds.   Respiratory: Effort normal. No respiratory distress.  GI: Soft. There is tenderness.  Musculoskeletal: Normal range of motion.  Neurological: She is alert.  Skin: Skin is warm and dry.  Psychiatric: Judgment normal. Her speech is not delayed. She is not slowed. Cognition and memory are normal. She does not exhibit a depressed mood. She expresses no suicidal ideation.    Review of Systems  Constitutional: Negative.   HENT: Negative.   Eyes: Negative.   Respiratory: Negative.   Cardiovascular: Negative.   Gastrointestinal: Positive for nausea and vomiting.  Musculoskeletal: Negative.   Skin: Negative.   Neurological: Negative.   Psychiatric/Behavioral: Positive for depression. Negative for hallucinations, substance abuse and suicidal ideas. The patient is nervous/anxious and has insomnia.     Blood pressure (!) 113/52, pulse 90, temperature 98.5 F (36.9 C), temperature source Oral, resp. rate 18, SpO2 97 %.There is no height or weight on file to calculate BMI.  General Appearance: Casual  Eye Contact:  Minimal  Speech:  Slow  Volume:  Decreased  Mood:  Dysphoric  Affect:  Congruent  Thought Process:  Goal Directed  Orientation:  Full (Time, Place, and Person)  Thought Content:  Logical  Suicidal Thoughts:  No  Homicidal Thoughts:  No  Memory:  Immediate;   Good Recent;   Fair Remote;   Fair  Judgement:  Fair  Insight:  Fair  Psychomotor Activity:  Decreased  Concentration:  Concentration: Fair  Recall:  [employer] of Knowledge:  Fair  Language:  Fair  Akathisia:  No  Handed:  Right  AIMS (if indicated):  Assets:  Financial Resources/Insurance Housing Social Support   ADL's:  Intact  Cognition:  WNL  Sleep:        Treatment Plan Summary: Daily contact with patient to assess and evaluate symptoms and progress in treatment, Medication management and Plan Continue current psychiatric medicine. Patient will follow-up with outpatient psychiatric provider as previously arranged. Supportive counseling and review of medical plan.  Disposition: Patient does not meet criteria for psychiatric inpatient admission. Supportive therapy provided about ongoing stressors.  Alethia Berthold, MD 06/13/2016 6:31 PM

## 2016-06-13 NOTE — Progress Notes (Signed)
  [facility] Adult Case Management Discharge Plan :  Will you be returning to the same living situation after discharge:  No. Pt was transferred to the medical floor for health reasons. At discharge, do you have transportation home?: Yes, pt will be picked up by her husband Do you have the ability to pay for your medications: Yes,  pt will be provided with prescriptions at discharge  Release of information consent forms completed and in the chart;  Patient's signature needed at discharge.  Patient to Follow up at: Follow-up Information    [facility] Follow up.   Why:  Please arrive for your hospital follow up at 2:30pm on Tuesday December 5th,2017 for medication management and therapy     Contact information: [facility] [place],  [place], [place] [address] Phone: [phone] Fax: [phone]       [facility] Follow up.   Specialty:  Neurosurgery Contact information: [address] [place] [address] [phone]           Next level of care provider has access to [place] and Suicide Prevention discussed: No, pt refused  Have you used any form of tobacco in the last 30 days? (Cigarettes, Smokeless Tobacco, Cigars, and/or Pipes): No  Has patient been referred to the Quitline?: N/A patient is not a smoker  Patient has been referred for addiction treatment:No, N/A  Alphonse Guild [id number] *Late Entry 06/13/2016, 12:53 PM

## 2016-06-13 NOTE — Progress Notes (Addendum)
Morse paid as initial visit from a referral [facility]). Pt was alert and eating breakfast. Pt was glad to be able to keep her food down which was different from yesterday. Pt spoke about her daughter who wanted to "dictate her life" (per Pt) Pt seemed indifferent as she spoke. CH provided the ministry of empathetic listening and prayer. CH is available for follow up as needed. Time of visit 9:45 AM    06/13/16 1400  Clinical Encounter Type  Visited With Patient  Visit Type Initial;Follow-up;Psychological support;Spiritual support  Referral From [place] Needs Prayer;Emotional

## 2016-06-14 LAB — URINE CULTURE

## 2016-06-15 NOTE — Discharge Summary (Signed)
[facility] [place] NAME: April Pham    MR#:  [id number]  DATE OF BIRTH:  Sep 02, 1955  DATE OF ADMISSION:  06/12/2016   ADMITTING PHYSICIAN: Nicholes Mango, MD  DATE OF DISCHARGE: 06/13/2016  6:46 PM  PRIMARY CARE PHYSICIAN: No PCP Per Patient   ADMISSION DIAGNOSIS:   acute gastritis  DISCHARGE DIAGNOSIS:   Principal Problem:   MDD (major depressive disorder) Active Problems:   Acute gastritis without hemorrhage   SECONDARY DIAGNOSIS:   Past Medical History:  Diagnosis Date  . Cancer ([place])   . Depression   . Hypertension     HOSPITAL COURSE:   60 year old female with past medical history significant for breast cancer in remission, hypertension, GERD, hyperlipidemia and major depression who was initially admitted to behavioral health unit for depression treatment was transferred to medical service for intractable nausea and vomiting.  #1 intractable nausea and vomiting-secondary to acute gastritis. -Patient was treated symptomatically and was started on Protonix twice a day. -Symptoms have resolved. She was receiving fluids as well in the hospital. Discharged on Prilosec twice a day. Also has Zofran prescription.  #2 acute renal failure-secondary to prerenal causes. Improved with IV fluids.  #3 major depression-not suicidal anymore. Seen by psychiatry. Cleared for discharge. Patient is being discharged on Prozac, Seroquel and will follow up with psychiatry as outpatient.  #4 history of breast cancer-continue hormonal treatment with Femara  Patient is stable medically and is being discharged home  DISCHARGE CONDITIONS:   Guarded  CONSULTS OBTAINED:   Treatment Team:  Gonzella Lex, MD  DRUG ALLERGIES:   Allergies  Allergen Reactions  . Ibuprofen   . Nsaids    DISCHARGE MEDICATIONS:     Medication List    TAKE these medications   AMITIZA 24 MCG capsule Generic drug:  lubiprostone Take 24 mcg by mouth  2 (two) times daily.   amitriptyline 50 MG tablet Commonly known as:  ELAVIL Take 1 tablet (50 mg total) by mouth at bedtime.   atorvastatin 10 MG tablet Commonly known as:  LIPITOR Take 1 tablet (10 mg total) by mouth daily at 6 PM.   cetirizine 10 MG tablet Commonly known as:  ZYRTEC Take 10 mg by mouth daily.   ergocalciferol 50000 units capsule Commonly known as:  VITAMIN D2 Take 50,000 Units by mouth once a week.   ferrous sulfate 325 (65 FE) MG EC tablet Take 325 mg by mouth daily.   FLUoxetine 40 MG capsule Commonly known as:  PROZAC Take 40 mg by mouth daily.   letrozole 2.5 MG tablet Commonly known as:  FEMARA Take 2.5 mg by mouth daily.   omeprazole 20 MG capsule Commonly known as:  PRILOSEC Take 20 mg by mouth 2 (two) times daily before a meal.   ondansetron 4 MG disintegrating tablet Commonly known as:  ZOFRAN ODT Take 1 tablet (4 mg total) by mouth every 8 (eight) hours as needed for nausea or vomiting.   QUEtiapine 100 MG tablet Commonly known as:  SEROQUEL Take 200 mg by mouth at bedtime.   vitamin C 500 MG tablet Commonly known as:  ASCORBIC ACID Take 500 mg by mouth daily.        DISCHARGE INSTRUCTIONS:   1. PCP f/u in 1 week 2. Psychiatry f/u in 1 week as recommended  DIET:   Cardiac diet  ACTIVITY:   Activity as tolerated  OXYGEN:   Home Oxygen: No.  Oxygen Delivery: room  air  DISCHARGE LOCATION:   home   If you experience worsening of your admission symptoms, develop shortness of breath, life threatening emergency, suicidal or homicidal thoughts you must seek medical attention immediately by calling 911 or calling your MD immediately  if symptoms less severe.  You Must read complete instructions/literature along with all the possible adverse reactions/side effects for all the Medicines you take and that have been prescribed to you. Take any new Medicines after you have completely understood and accpet all the possible  adverse reactions/side effects.   Please note  You were cared for by a hospitalist during your hospital stay. If you have any questions about your discharge medications or the care you received while you were in the hospital after you are discharged, you can call the unit and asked to speak with the hospitalist on call if the hospitalist that took care of you is not available. Once you are discharged, your primary care physician will handle any further medical issues. Please note that NO REFILLS for any discharge medications will be authorized once you are discharged, as it is imperative that you return to your primary care physician (or establish a relationship with a primary care physician if you do not have one) for your aftercare needs so that they can reassess your need for medications and monitor your lab values.    On the day of Discharge:  VITAL SIGNS:   Blood pressure (!) 113/52, pulse 90, temperature 98.5 F (36.9 C), temperature source Oral, resp. rate 18, SpO2 97 %.  PHYSICAL EXAMINATION:    GENERAL:  60 y.o.-year-old patient lying in the bed with no acute distress.  EYES: Pupils equal, round, reactive to light and accommodation. No scleral icterus. Extraocular muscles intact.  HEENT: Head atraumatic, normocephalic. Oropharynx and nasopharynx clear.  NECK:  Supple, no jugular venous distention. No thyroid enlargement, no tenderness.  LUNGS: Normal breath sounds bilaterally, no wheezing, rales,rhonchi or crepitation. No use of accessory muscles of respiration.  CARDIOVASCULAR: S1, S2 normal. No murmurs, rubs, or gallops.  ABDOMEN: Soft, non-tender, non-distended. Bowel sounds present. No organomegaly or mass.  EXTREMITIES: No pedal edema, cyanosis, or clubbing.  NEUROLOGIC: Cranial nerves II through XII are intact. Muscle strength 5/5 in all extremities. Sensation intact. Gait not checked.  PSYCHIATRIC: The patient is alert and oriented x 3. Flat affect.  SKIN: No obvious rash,  lesion, or ulcer.   DATA REVIEW:   CBC  Recent Labs Lab 06/12/16 1030  WBC 4.6  HGB 11.6*  HCT 35.3  PLT 204    Chemistries   Recent Labs Lab 06/12/16 1030 06/13/16 0424  NA 139 138  K 5.1 4.5  CL 108 106  CO2 24 25  GLUCOSE 102* 97  BUN 23* 24*  CREATININE 1.29* 1.17*  CALCIUM 10.2 9.2  AST 26  --   ALT 15  --   ALKPHOS 140*  --   BILITOT 0.7  --      Microbiology Results  Results for orders placed or performed during the hospital encounter of 06/12/16  Urine culture     Status: Abnormal   Collection Time: 06/12/16  5:18 PM  Result Value Ref Range Status   Specimen Description URINE, CLEAN CATCH  Final   Special Requests NONE  Final   Culture MULTIPLE SPECIES PRESENT, SUGGEST RECOLLECTION (A)  Final   Report Status 06/14/2016 FINAL  Final    RADIOLOGY:  No results found.   Management plans discussed with the patient, family and  they are in agreement.  CODE STATUS:  Code Status History    Date Active Date Inactive Code Status Order ID Comments User Context   06/12/2016  5:17 PM 06/13/2016  9:51 PM Full Code [id number]  Nicholes Mango, MD Inpatient   06/08/2016  1:39 PM 06/12/2016  3:39 PM Full Code [id number]  Hildred Priest, MD Inpatient   05/24/2016  8:38 AM 05/25/2016  4:11 PM Full Code [id number]  Rise Patience, MD Inpatient      TOTAL TIME TAKING CARE OF THIS PATIENT: 37 minutes.    Gladstone Lighter M.D on 06/15/2016 at 11:49 AM  Between 7am to 6pm - Pager - [phone]  After 6pm go to [email or web address] - [employer] [facility]  Office  [phone]  CC: Primary care physician; No PCP Per Patient   Note: This dictation was prepared with Dragon dictation along with smaller phrase technology. Any transcriptional errors that result from this process are unintentional.

## 2016-06-27 ENCOUNTER — Other Ambulatory Visit: Payer: Self-pay | Admitting: Neurological Surgery

## 2016-07-12 ENCOUNTER — Encounter (HOSPITAL_COMMUNITY): Payer: Self-pay | Admitting: *Deleted

## 2016-07-12 NOTE — Progress Notes (Signed)
Pt denies cardiac history, chest pain or sob. Pt thinks she's had an EKG done at [facility] in the last month. Have requested copy.

## 2016-07-13 ENCOUNTER — Inpatient Hospital Stay (HOSPITAL_COMMUNITY): Payer: Medicaid Other | Admitting: Anesthesiology

## 2016-07-13 ENCOUNTER — Inpatient Hospital Stay (HOSPITAL_COMMUNITY)
Admission: RE | Admit: 2016-07-13 | Discharge: 2016-07-17 | DRG: 026 | Disposition: A | Discharge status: Skilled Nursing Facility | Payer: Medicaid Other | Source: Ambulatory Visit | Attending: Neurological Surgery | Admitting: Neurological Surgery

## 2016-07-13 ENCOUNTER — Encounter (HOSPITAL_COMMUNITY)
Admission: RE | Disposition: A | Discharge status: Skilled Nursing Facility | Payer: Self-pay | Source: Ambulatory Visit | Attending: Neurological Surgery

## 2016-07-13 ENCOUNTER — Inpatient Hospital Stay (HOSPITAL_COMMUNITY): Payer: Medicaid Other

## 2016-07-13 ENCOUNTER — Encounter (HOSPITAL_COMMUNITY): Payer: Self-pay | Admitting: Anesthesiology

## 2016-07-13 DIAGNOSIS — D649 Anemia, unspecified: Secondary | ICD-10-CM | POA: Diagnosis present

## 2016-07-13 DIAGNOSIS — E669 Obesity, unspecified: Secondary | ICD-10-CM | POA: Diagnosis present

## 2016-07-13 DIAGNOSIS — Z79811 Long term (current) use of aromatase inhibitors: Secondary | ICD-10-CM | POA: Diagnosis not present

## 2016-07-13 DIAGNOSIS — F329 Major depressive disorder, single episode, unspecified: Secondary | ICD-10-CM | POA: Diagnosis present

## 2016-07-13 DIAGNOSIS — E785 Hyperlipidemia, unspecified: Secondary | ICD-10-CM | POA: Diagnosis present

## 2016-07-13 DIAGNOSIS — I129 Hypertensive chronic kidney disease with stage 1 through stage 4 chronic kidney disease, or unspecified chronic kidney disease: Secondary | ICD-10-CM | POA: Diagnosis present

## 2016-07-13 DIAGNOSIS — Z79899 Other long term (current) drug therapy: Secondary | ICD-10-CM | POA: Diagnosis not present

## 2016-07-13 DIAGNOSIS — C50911 Malignant neoplasm of unspecified site of right female breast: Secondary | ICD-10-CM | POA: Diagnosis present

## 2016-07-13 DIAGNOSIS — Z6833 Body mass index (BMI) 33.0-33.9, adult: Secondary | ICD-10-CM

## 2016-07-13 DIAGNOSIS — Z7951 Long term (current) use of inhaled steroids: Secondary | ICD-10-CM | POA: Diagnosis not present

## 2016-07-13 DIAGNOSIS — Z888 Allergy status to other drugs, medicaments and biological substances status: Secondary | ICD-10-CM

## 2016-07-13 DIAGNOSIS — N189 Chronic kidney disease, unspecified: Secondary | ICD-10-CM | POA: Diagnosis present

## 2016-07-13 DIAGNOSIS — G935 Compression of brain: Secondary | ICD-10-CM | POA: Diagnosis present

## 2016-07-13 DIAGNOSIS — I69351 Hemiplegia and hemiparesis following cerebral infarction affecting right dominant side: Secondary | ICD-10-CM | POA: Diagnosis not present

## 2016-07-13 DIAGNOSIS — K219 Gastro-esophageal reflux disease without esophagitis: Secondary | ICD-10-CM | POA: Diagnosis present

## 2016-07-13 DIAGNOSIS — R2681 Unsteadiness on feet: Secondary | ICD-10-CM

## 2016-07-13 DIAGNOSIS — R51 Headache: Secondary | ICD-10-CM | POA: Diagnosis present

## 2016-07-13 HISTORY — DX: Unspecified osteoarthritis, unspecified site: M19.90

## 2016-07-13 HISTORY — DX: Chronic kidney disease, unspecified: N18.9

## 2016-07-13 HISTORY — PX: SUBOCCIPITAL CRANIECTOMY CERVICAL LAMINECTOMY: SHX5404

## 2016-07-13 HISTORY — DX: Personal history of urinary calculi: Z87.442

## 2016-07-13 HISTORY — DX: Compression of brain: G93.5

## 2016-07-13 HISTORY — DX: Unspecified asthma, uncomplicated: J45.909

## 2016-07-13 HISTORY — DX: Headache, unspecified: R51.9

## 2016-07-13 HISTORY — DX: Pneumonia, unspecified organism: J18.9

## 2016-07-13 HISTORY — DX: Irritable bowel syndrome, unspecified: K58.9

## 2016-07-13 HISTORY — DX: Carpal tunnel syndrome, unspecified upper limb: G56.00

## 2016-07-13 HISTORY — DX: Cardiac murmur, unspecified: R01.1

## 2016-07-13 HISTORY — DX: Cerebral infarction, unspecified: I63.9

## 2016-07-13 HISTORY — DX: Anemia, unspecified: D64.9

## 2016-07-13 HISTORY — DX: Personal history of other diseases of the digestive system: Z87.19

## 2016-07-13 HISTORY — DX: Headache: R51

## 2016-07-13 HISTORY — DX: Gastro-esophageal reflux disease without esophagitis: K21.9

## 2016-07-13 LAB — TYPE AND SCREEN
ABO/RH(D): O POS
ANTIBODY SCREEN: NEGATIVE

## 2016-07-13 LAB — CBC WITH DIFFERENTIAL/PLATELET
Basophils Absolute: 0 10*3/uL (ref 0.0–0.1)
Basophils Relative: 0 %
Eosinophils Absolute: 0.2 10*3/uL (ref 0.0–0.7)
Eosinophils Relative: 6 %
HEMATOCRIT: 29.6 % — AB (ref 36.0–46.0)
HEMOGLOBIN: 9.5 g/dL — AB (ref 12.0–15.0)
LYMPHS ABS: 1.3 10*3/uL (ref 0.7–4.0)
LYMPHS PCT: 35 %
MCH: 27.4 pg (ref 26.0–34.0)
MCHC: 32.1 g/dL (ref 30.0–36.0)
MCV: 85.3 fL (ref 78.0–100.0)
Monocytes Absolute: 0.3 10*3/uL (ref 0.1–1.0)
Monocytes Relative: 9 %
NEUTROS ABS: 1.9 10*3/uL (ref 1.7–7.7)
NEUTROS PCT: 51 %
Platelets: DECREASED 10*3/uL (ref 150–400)
RBC: 3.47 MIL/uL — AB (ref 3.87–5.11)
RDW: 13.3 % (ref 11.5–15.5)
WBC: 3.7 10*3/uL — AB (ref 4.0–10.5)

## 2016-07-13 LAB — BASIC METABOLIC PANEL
Anion gap: 11 (ref 5–15)
BUN: 14 mg/dL (ref 6–20)
CHLORIDE: 105 mmol/L (ref 101–111)
CO2: 21 mmol/L — ABNORMAL LOW (ref 22–32)
Calcium: 9.8 mg/dL (ref 8.9–10.3)
Creatinine, Ser: 1.06 mg/dL — ABNORMAL HIGH (ref 0.44–1.00)
GFR calc non Af Amer: 55 mL/min — ABNORMAL LOW (ref >60)
Glucose, Bld: 86 mg/dL (ref 65–99)
POTASSIUM: 4 mmol/L (ref 3.5–5.1)
SODIUM: 137 mmol/L (ref 135–145)

## 2016-07-13 LAB — PROTIME-INR
INR: 1.01
PROTHROMBIN TIME: 13.4 s (ref 11.4–15.2)

## 2016-07-13 LAB — SURGICAL PCR SCREEN
MRSA, PCR: NEGATIVE
Staphylococcus aureus: NEGATIVE

## 2016-07-13 LAB — ABO/RH: ABO/RH(D): O POS

## 2016-07-13 SURGERY — SUBOCCIPITAL CRANIECTOMY CERVICAL LAMINECTOMY/DURAPLASTY
Anesthesia: General

## 2016-07-13 MED ORDER — GLYCOPYRROLATE 0.2 MG/ML IJ SOLN
INTRAMUSCULAR | Status: DC | PRN
  Administered 2016-07-13: 0.2 mg via INTRAVENOUS

## 2016-07-13 MED ORDER — 0.9 % SODIUM CHLORIDE (POUR BTL) OPTIME
TOPICAL | Status: DC | PRN
  Administered 2016-07-13: 1000 mL

## 2016-07-13 MED ORDER — FENTANYL CITRATE (PF) 100 MCG/2ML IJ SOLN
INTRAMUSCULAR | Status: AC
Start: 2016-07-13 — End: 2016-07-13
  Filled 2016-07-13: qty 4

## 2016-07-13 MED ORDER — METHOCARBAMOL 500 MG PO TABS
500.0000 mg | ORAL_TABLET | Freq: Four times a day (QID) | ORAL | Status: DC | PRN
Start: 2016-07-13 — End: 2016-07-17
  Administered 2016-07-13 – 2016-07-17 (×8): 500 mg via ORAL
  Filled 2016-07-13 (×8): qty 1

## 2016-07-13 MED ORDER — MEPERIDINE HCL 25 MG/ML IJ SOLN: 6.2500 mg | INTRAMUSCULAR | Status: DC | PRN

## 2016-07-13 MED ORDER — ONDANSETRON HCL 4 MG/2ML IJ SOLN
INTRAMUSCULAR | Status: AC
  Filled 2016-07-13: qty 4

## 2016-07-13 MED ORDER — METOCLOPRAMIDE HCL 5 MG/ML IJ SOLN: 10.0000 mg | Freq: Once | INTRAMUSCULAR | Status: DC | PRN

## 2016-07-13 MED ORDER — PHENYLEPHRINE 40 MCG/ML (10ML) SYRINGE FOR IV PUSH (FOR BLOOD PRESSURE SUPPORT)
PREFILLED_SYRINGE | INTRAVENOUS | Status: AC
  Filled 2016-07-13: qty 10

## 2016-07-13 MED ORDER — LIDOCAINE-EPINEPHRINE (PF) 2 %-1:200000 IJ SOLN
INTRAMUSCULAR | Status: AC
  Filled 2016-07-13: qty 20

## 2016-07-13 MED ORDER — CEFAZOLIN SODIUM-DEXTROSE 2-4 GM/100ML-% IV SOLN
2.0000 g | INTRAVENOUS | Status: AC
  Administered 2016-07-13: 2 g via INTRAVENOUS
  Filled 2016-07-13: qty 100

## 2016-07-13 MED ORDER — HYDROCHLOROTHIAZIDE 12.5 MG PO CAPS
12.5000 mg | ORAL_CAPSULE | Freq: Every day | ORAL | Status: DC
  Administered 2016-07-13 – 2016-07-17 (×5): 12.5 mg via ORAL
  Filled 2016-07-13 (×5): qty 1

## 2016-07-13 MED ORDER — METHOCARBAMOL 1000 MG/10ML IJ SOLN
500.0000 mg | Freq: Four times a day (QID) | INTRAVENOUS | Status: DC | PRN
  Filled 2016-07-13: qty 5

## 2016-07-13 MED ORDER — LETROZOLE 2.5 MG PO TABS
2.5000 mg | ORAL_TABLET | Freq: Every day | ORAL | Status: DC
  Administered 2016-07-13 – 2016-07-17 (×5): 2.5 mg via ORAL
  Filled 2016-07-13 (×5): qty 1

## 2016-07-13 MED ORDER — OXYCODONE-ACETAMINOPHEN 5-325 MG PO TABS
1.0000 | ORAL_TABLET | ORAL | Status: DC | PRN
  Administered 2016-07-13 – 2016-07-14 (×2): 2 via ORAL
  Filled 2016-07-13 (×3): qty 2

## 2016-07-13 MED ORDER — ACETAMINOPHEN 10 MG/ML IV SOLN
INTRAVENOUS | Status: DC | PRN
  Administered 2016-07-13: 1000 mg via INTRAVENOUS

## 2016-07-13 MED ORDER — BACITRACIN ZINC 500 UNIT/GM EX OINT
TOPICAL_OINTMENT | CUTANEOUS | Status: DC | PRN
  Administered 2016-07-13: 1 via TOPICAL

## 2016-07-13 MED ORDER — SCOPOLAMINE 1 MG/3DAYS TD PT72
MEDICATED_PATCH | TRANSDERMAL | Status: AC
  Filled 2016-07-13: qty 1

## 2016-07-13 MED ORDER — LIDOCAINE HCL (CARDIAC) 20 MG/ML IV SOLN
INTRAVENOUS | Status: DC | PRN
  Administered 2016-07-13: 80 mg via INTRAVENOUS

## 2016-07-13 MED ORDER — ALBUTEROL SULFATE (2.5 MG/3ML) 0.083% IN NEBU: 2.5000 mg | INHALATION_SOLUTION | Freq: Four times a day (QID) | RESPIRATORY_TRACT | Status: DC | PRN

## 2016-07-13 MED ORDER — ONDANSETRON 4 MG PO TBDP
4.0000 mg | ORAL_TABLET | Freq: Three times a day (TID) | ORAL | Status: DC | PRN
  Administered 2016-07-14: 4 mg via ORAL
  Filled 2016-07-13 (×2): qty 1

## 2016-07-13 MED ORDER — DEXAMETHASONE SODIUM PHOSPHATE 4 MG/ML IJ SOLN
INTRAMUSCULAR | Status: DC | PRN
  Administered 2016-07-13: 10 mg via INTRAVENOUS

## 2016-07-13 MED ORDER — SUGAMMADEX SODIUM 200 MG/2ML IV SOLN
INTRAVENOUS | Status: DC | PRN
  Administered 2016-07-13: 400 mg via INTRAVENOUS

## 2016-07-13 MED ORDER — EPHEDRINE 5 MG/ML INJ
INTRAVENOUS | Status: AC
  Filled 2016-07-13: qty 20

## 2016-07-13 MED ORDER — SODIUM CHLORIDE 0.9% FLUSH
3.0000 mL | Freq: Two times a day (BID) | INTRAVENOUS | Status: DC
  Administered 2016-07-14 – 2016-07-15 (×3): 3 mL via INTRAVENOUS

## 2016-07-13 MED ORDER — FERROUS SULFATE 325 (65 FE) MG PO TABS
325.0000 mg | ORAL_TABLET | Freq: Every day | ORAL | Status: DC
  Administered 2016-07-13 – 2016-07-17 (×5): 325 mg via ORAL
  Filled 2016-07-13 (×5): qty 1

## 2016-07-13 MED ORDER — ONDANSETRON HCL 4 MG/2ML IJ SOLN
INTRAMUSCULAR | Status: DC | PRN
  Administered 2016-07-13 (×2): 4 mg via INTRAVENOUS

## 2016-07-13 MED ORDER — VITAMIN C 500 MG PO TABS
500.0000 mg | ORAL_TABLET | Freq: Every day | ORAL | Status: DC
  Administered 2016-07-13 – 2016-07-17 (×5): 500 mg via ORAL
  Filled 2016-07-13 (×5): qty 1

## 2016-07-13 MED ORDER — LUBIPROSTONE 24 MCG PO CAPS
24.0000 ug | ORAL_CAPSULE | Freq: Two times a day (BID) | ORAL | Status: DC
Start: 2016-07-13 — End: 2016-07-17
  Administered 2016-07-13 – 2016-07-17 (×8): 24 ug via ORAL
  Filled 2016-07-13 (×8): qty 1

## 2016-07-13 MED ORDER — ACETAMINOPHEN 650 MG RE SUPP: 650.0000 mg | RECTAL | Status: DC | PRN

## 2016-07-13 MED ORDER — DEXAMETHASONE SODIUM PHOSPHATE 10 MG/ML IJ SOLN
INTRAMUSCULAR | Status: AC
  Filled 2016-07-13: qty 1

## 2016-07-13 MED ORDER — BACITRACIN ZINC 500 UNIT/GM EX OINT
TOPICAL_OINTMENT | CUTANEOUS | Status: AC
  Filled 2016-07-13: qty 28.35

## 2016-07-13 MED ORDER — PROPOFOL 10 MG/ML IV BOLUS
INTRAVENOUS | Status: DC | PRN
  Administered 2016-07-13: 20 mg via INTRAVENOUS
  Administered 2016-07-13: 100 mg via INTRAVENOUS

## 2016-07-13 MED ORDER — ROCURONIUM BROMIDE 100 MG/10ML IV SOLN
INTRAVENOUS | Status: DC | PRN
  Administered 2016-07-13: 40 mg via INTRAVENOUS

## 2016-07-13 MED ORDER — LISINOPRIL 10 MG PO TABS
10.0000 mg | ORAL_TABLET | Freq: Every day | ORAL | Status: DC
  Administered 2016-07-13 – 2016-07-17 (×5): 10 mg via ORAL
  Filled 2016-07-13 (×5): qty 1

## 2016-07-13 MED ORDER — SUGAMMADEX SODIUM 500 MG/5ML IV SOLN
INTRAVENOUS | Status: AC
  Filled 2016-07-13: qty 10

## 2016-07-13 MED ORDER — THROMBIN 20000 UNITS EX SOLR
CUTANEOUS | Status: DC | PRN
  Administered 2016-07-13: 12:00:00 via TOPICAL

## 2016-07-13 MED ORDER — SODIUM CHLORIDE 0.9 % IV SOLN
INTRAVENOUS | Status: DC
  Administered 2016-07-13 (×2): via INTRAVENOUS

## 2016-07-13 MED ORDER — ACETAMINOPHEN 325 MG PO TABS
650.0000 mg | ORAL_TABLET | ORAL | Status: DC | PRN
  Administered 2016-07-15 (×2): 650 mg via ORAL
  Filled 2016-07-13 (×3): qty 2

## 2016-07-13 MED ORDER — ACETAMINOPHEN 10 MG/ML IV SOLN
INTRAVENOUS | Status: AC
  Filled 2016-07-13: qty 100

## 2016-07-13 MED ORDER — POTASSIUM CHLORIDE IN NACL 20-0.9 MEQ/L-% IV SOLN
INTRAVENOUS | Status: DC
  Administered 2016-07-13: 16:00:00 via INTRAVENOUS
  Filled 2016-07-13 (×4): qty 1000

## 2016-07-13 MED ORDER — SODIUM CHLORIDE 0.9% FLUSH: 3.0000 mL | INTRAVENOUS | Status: DC | PRN

## 2016-07-13 MED ORDER — SODIUM CHLORIDE 0.9 % IR SOLN
Status: DC | PRN
  Administered 2016-07-13: 12:00:00

## 2016-07-13 MED ORDER — PHENYLEPHRINE 40 MCG/ML (10ML) SYRINGE FOR IV PUSH (FOR BLOOD PRESSURE SUPPORT)
PREFILLED_SYRINGE | INTRAVENOUS | Status: AC
  Filled 2016-07-13: qty 30

## 2016-07-13 MED ORDER — PANTOPRAZOLE SODIUM 40 MG PO TBEC
40.0000 mg | DELAYED_RELEASE_TABLET | Freq: Every day | ORAL | Status: DC
  Administered 2016-07-13 – 2016-07-17 (×5): 40 mg via ORAL
  Filled 2016-07-13 (×5): qty 1

## 2016-07-13 MED ORDER — MORPHINE SULFATE (PF) 2 MG/ML IV SOLN
1.0000 mg | INTRAVENOUS | Status: DC | PRN
Start: 2016-07-13 — End: 2016-07-14
  Administered 2016-07-13 – 2016-07-14 (×4): 4 mg via INTRAVENOUS
  Filled 2016-07-13 (×4): qty 2
  Filled 2016-07-13: qty 1

## 2016-07-13 MED ORDER — MIDAZOLAM HCL 5 MG/5ML IJ SOLN
INTRAMUSCULAR | Status: DC | PRN
  Administered 2016-07-13: 2 mg via INTRAVENOUS

## 2016-07-13 MED ORDER — PHENOL 1.4 % MT LIQD
1.0000 | OROMUCOSAL | Status: DC | PRN
  Administered 2016-07-14: 1 via OROMUCOSAL
  Filled 2016-07-13: qty 177

## 2016-07-13 MED ORDER — MENTHOL 3 MG MT LOZG: 1.0000 | LOZENGE | OROMUCOSAL | Status: DC | PRN

## 2016-07-13 MED ORDER — VITAMIN D (ERGOCALCIFEROL) 1.25 MG (50000 UNIT) PO CAPS
50000.0000 [IU] | ORAL_CAPSULE | ORAL | Status: DC
  Filled 2016-07-13: qty 1

## 2016-07-13 MED ORDER — ONDANSETRON HCL 4 MG/2ML IJ SOLN
4.0000 mg | INTRAMUSCULAR | Status: DC | PRN
  Administered 2016-07-13: 4 mg via INTRAVENOUS
  Filled 2016-07-13: qty 2

## 2016-07-13 MED ORDER — LIDOCAINE 2% (20 MG/ML) 5 ML SYRINGE
INTRAMUSCULAR | Status: AC
  Filled 2016-07-13: qty 10

## 2016-07-13 MED ORDER — CHLORHEXIDINE GLUCONATE CLOTH 2 % EX PADS: 6.0000 | MEDICATED_PAD | Freq: Once | CUTANEOUS | Status: DC

## 2016-07-13 MED ORDER — PHENYLEPHRINE HCL 10 MG/ML IJ SOLN
INTRAMUSCULAR | Status: DC | PRN
  Administered 2016-07-13 (×6): 80 ug via INTRAVENOUS

## 2016-07-13 MED ORDER — AMITRIPTYLINE HCL 25 MG PO TABS
50.0000 mg | ORAL_TABLET | Freq: Every day | ORAL | Status: DC
Start: 2016-07-13 — End: 2016-07-17
  Administered 2016-07-13 – 2016-07-16 (×4): 50 mg via ORAL
  Filled 2016-07-13 (×4): qty 2

## 2016-07-13 MED ORDER — SUCCINYLCHOLINE CHLORIDE 200 MG/10ML IV SOSY
PREFILLED_SYRINGE | INTRAVENOUS | Status: AC
  Filled 2016-07-13: qty 10

## 2016-07-13 MED ORDER — CEFAZOLIN SODIUM-DEXTROSE 2-4 GM/100ML-% IV SOLN
2.0000 g | Freq: Three times a day (TID) | INTRAVENOUS | Status: AC
  Administered 2016-07-13 (×2): 2 g via INTRAVENOUS
  Filled 2016-07-13 (×2): qty 100

## 2016-07-13 MED ORDER — ATORVASTATIN CALCIUM 10 MG PO TABS
10.0000 mg | ORAL_TABLET | Freq: Every day | ORAL | Status: DC
  Administered 2016-07-14 – 2016-07-17 (×4): 10 mg via ORAL
  Filled 2016-07-13 (×4): qty 1

## 2016-07-13 MED ORDER — FENTANYL CITRATE (PF) 100 MCG/2ML IJ SOLN
INTRAMUSCULAR | Status: DC | PRN
  Administered 2016-07-13: 50 ug via INTRAVENOUS
  Administered 2016-07-13: 100 ug via INTRAVENOUS
  Administered 2016-07-13: 50 ug via INTRAVENOUS
  Administered 2016-07-13: 100 ug via INTRAVENOUS

## 2016-07-13 MED ORDER — HYDROMORPHONE HCL 1 MG/ML IJ SOLN
INTRAMUSCULAR | Status: AC
  Filled 2016-07-13: qty 1

## 2016-07-13 MED ORDER — MIDAZOLAM HCL 2 MG/2ML IJ SOLN
INTRAMUSCULAR | Status: AC
  Filled 2016-07-13: qty 2

## 2016-07-13 MED ORDER — LIDOCAINE-EPINEPHRINE (PF) 2 %-1:200000 IJ SOLN
INTRAMUSCULAR | Status: DC | PRN
  Administered 2016-07-13: 8 mL

## 2016-07-13 MED ORDER — ONDANSETRON HCL 4 MG/2ML IJ SOLN
INTRAMUSCULAR | Status: AC
  Filled 2016-07-13: qty 2

## 2016-07-13 MED ORDER — FLUOXETINE HCL 20 MG PO CAPS
40.0000 mg | ORAL_CAPSULE | Freq: Every day | ORAL | Status: DC
  Administered 2016-07-13 – 2016-07-17 (×5): 40 mg via ORAL
  Filled 2016-07-13 (×5): qty 2

## 2016-07-13 MED ORDER — ROCURONIUM BROMIDE 50 MG/5ML IV SOSY
PREFILLED_SYRINGE | INTRAVENOUS | Status: AC
  Filled 2016-07-13: qty 10

## 2016-07-13 MED ORDER — SODIUM CHLORIDE 0.9 % IV SOLN: 250.0000 mL | INTRAVENOUS | Status: DC

## 2016-07-13 MED ORDER — FENTANYL CITRATE (PF) 100 MCG/2ML IJ SOLN
INTRAMUSCULAR | Status: AC
Start: 2016-07-13 — End: 2016-07-13
  Filled 2016-07-13: qty 2

## 2016-07-13 MED ORDER — QUETIAPINE FUMARATE 50 MG PO TABS
200.0000 mg | ORAL_TABLET | Freq: Every day | ORAL | Status: DC
Start: 2016-07-13 — End: 2016-07-17
  Administered 2016-07-13 – 2016-07-16 (×4): 200 mg via ORAL
  Filled 2016-07-13 (×4): qty 4

## 2016-07-13 MED ORDER — ACETAMINOPHEN 500 MG PO TABS
1000.0000 mg | ORAL_TABLET | Freq: Four times a day (QID) | ORAL | Status: DC | PRN
  Administered 2016-07-16 – 2016-07-17 (×2): 1000 mg via ORAL
  Filled 2016-07-13: qty 2

## 2016-07-13 MED ORDER — SENNA 8.6 MG PO TABS
1.0000 | ORAL_TABLET | Freq: Two times a day (BID) | ORAL | Status: DC
  Administered 2016-07-13 – 2016-07-17 (×8): 8.6 mg via ORAL
  Filled 2016-07-13 (×8): qty 1

## 2016-07-13 MED ORDER — LORATADINE 10 MG PO TABS
10.0000 mg | ORAL_TABLET | Freq: Every day | ORAL | Status: DC
  Administered 2016-07-13 – 2016-07-17 (×5): 10 mg via ORAL
  Filled 2016-07-13 (×5): qty 1

## 2016-07-13 MED ORDER — THROMBIN 20000 UNITS EX SOLR
CUTANEOUS | Status: AC
  Filled 2016-07-13: qty 20000

## 2016-07-13 MED ORDER — PROPOFOL 10 MG/ML IV BOLUS
INTRAVENOUS | Status: AC
  Filled 2016-07-13: qty 20

## 2016-07-13 MED ORDER — MUPIROCIN 2 % EX OINT
1.0000 "application " | TOPICAL_OINTMENT | Freq: Once | CUTANEOUS | Status: AC
  Administered 2016-07-13: 1 via TOPICAL
  Filled 2016-07-13: qty 22

## 2016-07-13 MED ORDER — LACTATED RINGERS IV SOLN
INTRAVENOUS | Status: DC | PRN
  Administered 2016-07-13: 11:00:00 via INTRAVENOUS

## 2016-07-13 MED ORDER — SCOPOLAMINE 1 MG/3DAYS TD PT72
1.0000 | MEDICATED_PATCH | TRANSDERMAL | Status: DC
Start: 2016-07-13 — End: 2016-07-13
  Administered 2016-07-13: 1.5 mg via TRANSDERMAL

## 2016-07-13 MED ORDER — HYDROMORPHONE HCL 1 MG/ML IJ SOLN
0.2500 mg | INTRAMUSCULAR | Status: DC | PRN
Start: 2016-07-13 — End: 2016-07-13
  Administered 2016-07-13 (×2): 0.5 mg via INTRAVENOUS

## 2016-07-13 MED ORDER — LISINOPRIL-HYDROCHLOROTHIAZIDE 10-12.5 MG PO TABS: 1.0000 | ORAL_TABLET | Freq: Every day | ORAL | Status: DC

## 2016-07-13 SURGICAL SUPPLY — 59 items
BAG DECANTER FOR FLEXI CONT (MISCELLANEOUS) ×2 IMPLANT
BENZOIN TINCTURE PRP APPL 2/3 (GAUZE/BANDAGES/DRESSINGS) ×2 IMPLANT
BLADE CLIPPER SPEC (BLADE) IMPLANT
BUR ACORN 9.0 PRECISION (BURR) ×2 IMPLANT
BUR MATCHSTICK NEURO 3.0 LAGG (BURR) IMPLANT
CANISTER SUCT 3000ML PPV (MISCELLANEOUS) ×4 IMPLANT
CARTRIDGE OIL MAESTRO DRILL (MISCELLANEOUS) ×1 IMPLANT
CLIP TI MEDIUM 6 (CLIP) IMPLANT
CORDS BIPOLAR (ELECTRODE) ×2 IMPLANT
DIFFUSER DRILL AIR PNEUMATIC (MISCELLANEOUS) ×2 IMPLANT
DRAPE LAPAROTOMY 100X72 PEDS (DRAPES) ×2 IMPLANT
DRAPE MICROSCOPE LEICA (MISCELLANEOUS) IMPLANT
DRAPE POUCH INSTRU U-SHP 10X18 (DRAPES) ×2 IMPLANT
DRAPE WARM FLUID 44X44 (DRAPE) ×2 IMPLANT
DRSG OPSITE POSTOP 4X6 (GAUZE/BANDAGES/DRESSINGS) ×2 IMPLANT
ELECT CAUTERY BLADE 6.4 (BLADE) ×2 IMPLANT
ELECT REM PT RETURN 9FT ADLT (ELECTROSURGICAL) ×2
ELECTRODE REM PT RTRN 9FT ADLT (ELECTROSURGICAL) ×1 IMPLANT
GAUZE SPONGE 4X4 12PLY STRL (GAUZE/BANDAGES/DRESSINGS) ×2 IMPLANT
GAUZE SPONGE 4X4 16PLY XRAY LF (GAUZE/BANDAGES/DRESSINGS) IMPLANT
GLOVE BIO SURGEON STRL SZ7 (GLOVE) ×4 IMPLANT
GLOVE BIO SURGEON STRL SZ7.5 (GLOVE) ×4 IMPLANT
GLOVE BIO SURGEON STRL SZ8 (GLOVE) ×4 IMPLANT
GLOVE BIO SURGEON STRL SZ8.5 (GLOVE) ×2 IMPLANT
GOWN STRL REUS W/ TWL LRG LVL3 (GOWN DISPOSABLE) IMPLANT
GOWN STRL REUS W/ TWL XL LVL3 (GOWN DISPOSABLE) IMPLANT
GOWN STRL REUS W/TWL 2XL LVL3 (GOWN DISPOSABLE) ×2 IMPLANT
GOWN STRL REUS W/TWL LRG LVL3 (GOWN DISPOSABLE)
GOWN STRL REUS W/TWL XL LVL3 (GOWN DISPOSABLE)
HEMOSTAT POWDER KIT SURGIFOAM (HEMOSTASIS) IMPLANT
HOOK DURA (MISCELLANEOUS) ×2 IMPLANT
KIT BASIN OR (CUSTOM PROCEDURE TRAY) ×2 IMPLANT
KIT ROOM TURNOVER OR (KITS) ×2 IMPLANT
NEEDLE HYPO 22GX1.5 SAFETY (NEEDLE) ×2 IMPLANT
NS IRRIG 1000ML POUR BTL (IV SOLUTION) ×2 IMPLANT
OIL CARTRIDGE MAESTRO DRILL (MISCELLANEOUS) ×2
PACK LAMINECTOMY NEURO (CUSTOM PROCEDURE TRAY) ×2 IMPLANT
PAD ARMBOARD 7.5X6 YLW CONV (MISCELLANEOUS) ×6 IMPLANT
PATTIES SURGICAL .25X.25 (GAUZE/BANDAGES/DRESSINGS) IMPLANT
PATTIES SURGICAL .5 X.5 (GAUZE/BANDAGES/DRESSINGS) IMPLANT
PATTIES SURGICAL .5 X3 (DISPOSABLE) IMPLANT
PATTIES SURGICAL 1X1 (DISPOSABLE) IMPLANT
PIN MAYFIELD SKULL DISP (PIN) IMPLANT
RUBBERBAND STERILE (MISCELLANEOUS) IMPLANT
SPONGE NEURO XRAY DETECT 1X3 (DISPOSABLE) IMPLANT
STAPLER VISISTAT 35W (STAPLE) IMPLANT
STRIP CLOSURE SKIN 1/2X4 (GAUZE/BANDAGES/DRESSINGS) ×2 IMPLANT
SUT ETHILON 3 0 FSL (SUTURE) IMPLANT
SUT NURALON 4 0 TR CR/8 (SUTURE) ×4 IMPLANT
SUT VIC AB 0 CT1 18XCR BRD8 (SUTURE) ×2 IMPLANT
SUT VIC AB 0 CT1 8-18 (SUTURE) ×2
SUT VIC AB 2-0 CP2 18 (SUTURE) ×2 IMPLANT
SUT VIC AB 3-0 SH 8-18 (SUTURE) ×2 IMPLANT
SYR CONTROL 10ML LL (SYRINGE) ×2 IMPLANT
TOWEL OR 17X24 6PK STRL BLUE (TOWEL DISPOSABLE) ×2 IMPLANT
TOWEL OR 17X26 10 PK STRL BLUE (TOWEL DISPOSABLE) ×2 IMPLANT
TRAY FOLEY W/METER SILVER 16FR (SET/KITS/TRAYS/PACK) IMPLANT
UNDERPAD 30X30 (UNDERPADS AND DIAPERS) IMPLANT
WATER STERILE IRR 1000ML POUR (IV SOLUTION) ×2 IMPLANT

## 2016-07-13 NOTE — Op Note (Signed)
07/13/2016  12:25 PM  PATIENT:  April Pham  61 y.o. female  PRE-OPERATIVE DIAGNOSIS:  Chiari I malformation  POST-OPERATIVE DIAGNOSIS:  Same  PROCEDURE:  Suboccipital decompression with C1 laminectomy for Chiari I decompression  SURGEON:  Sherley Bounds, MD  ASSISTANTS: Dr. Arnoldo Morale  ANESTHESIA:   General  EBL: 25 ml  Total I/O In: 500 [I.V.:500] Out: 25 [Blood:25]  BLOOD ADMINISTERED:none  DRAINS: None   SPECIMEN:  No Specimen  INDICATION FOR PROCEDURE: This patient presented with excisional headaches as well as dizziness and numbness and tingling and pain in her arms. MRI and CT scan showed an 8 mm Chiari I malformation. She tried medical management without relief. I recommended suboccipital decompression. Patient understood the risks, benefits, and alternatives and potential outcomes and wished to proceed.  PROCEDURE DETAILS: The patient was brought to the operating room. Generalized endotracheal anesthesia was induced. The patient was affixed a 3 point [place] headrest and rolled into the prone position on chest rolls. All pressure points were padded. The posterior cervical region was cleaned and prepped with DuraPrep and then draped in the usual sterile fashion. 7 cc of local anesthesia was injected and a dorsal midline incision was made in the suboccipital region and carried down to the cervical fascia just superior to C2. The fascia was opened and the paraspinous musculature was taken down to expose the suboccipital region as well as the ring of C1. A 3 mm Kerrison punch was used to perform a C1 laminectomy. The high-speed drill was used to perform a craniectomy in the suboccipital region. The craniectomy was finished with a Kerrison punch. Tight band across the occipitocervical junction and this was removed in its entirety. I did not feel based on intraoperative findings and preoperative imaging that she needed a duraplasty in order to further decompress the posterior fossa. I  irrigated with saline solution containing bacitracin. I lined the dura with Gelfoam. After hemostasis was achieved I closed the muscle and the fascia with 0 Vicryl, subcutaneous tissue with 2-0 Vicryl, and the subcuticular tissue with 3-0 Vicryl. The skin was closed with benzoin and Steri-Strips. A sterile dressing was applied, the patient was turned to the supine position and taken out of the headrest, awakened from general anesthesia and transferred to the recovery room in stable condition. At the end of the procedure all sponge, needle and instrument counts were correct.   PLAN OF CARE: Admit to inpatient   PATIENT DISPOSITION:  PACU - hemodynamically stable.   Delay start of Pharmacological VTE agent (>24hrs) due to surgical blood loss or risk of bleeding:  yes

## 2016-07-13 NOTE — H&P (Signed)
Subjective: Patient is a 61 y.o. female admitted for Chiari I malformation causing headaches and dizziness and arm symptoms. Onset of symptoms was several months ago, gradually worsening since that time.  The pain is rated severe, and is located at the occipital region and radiates to head and arms. The pain is described as aching and occurs all day. The symptoms have been progressive. Symptoms are exacerbated by exercise. MRI or CT showed 8 mm Chiari I malformation   Past Medical History:  Diagnosis Date  . Anemia    low iron and low hemoglobin  . Arthritis   . Asthma   . Cancer [facility])    Breast cancer - right  . Carpal tunnel syndrome    left (right wrist has been corrected)  . Chiari malformation type I ([place])   . Chronic kidney disease    per MD notes in EPIC  . Depression   . GERD (gastroesophageal reflux disease)   . Headache   . Heart murmur    as a child  . History of hiatal hernia   . History of kidney stones   . Hypertension   . Irritable bowel syndrome   . Pneumonia   . 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 ABDOMINAL HYSTERECTOMY    . APPENDECTOMY    . BREAST SURGERY Right    lumpectomy and lymph node removed  . CARPAL TUNNEL RELEASE Right     Prior to Admission medications   Medication Sig Start Date End Date Taking? Authorizing Provider  acetaminophen (TYLENOL) 500 MG tablet Take 1,000 mg by mouth every 6 (six) hours as needed for headache.   Yes Historical Provider, MD  albuterol (PROVENTIL HFA;VENTOLIN HFA) 108 (90 Base) MCG/ACT inhaler Inhale 2 puffs into the lungs every 6 (six) hours as needed for wheezing or shortness of breath.   Yes Historical Provider, MD  AMITIZA 24 MCG capsule Take 24 mcg by mouth 2 (two) times daily. 04/06/16  Yes Historical Provider, MD  amitriptyline (ELAVIL) 50 MG tablet Take 1 tablet (50 mg total) by mouth at bedtime. 06/12/16  Yes Hildred Priest, MD  atorvastatin (LIPITOR) 10 MG tablet Take 1 tablet (10 mg total) by mouth daily at 6 PM. 06/12/16  Yes Hildred Priest, MD  cetirizine (ZYRTEC) 10 MG tablet Take 10 mg by mouth daily.   Yes Historical Provider, MD  ergocalciferol (VITAMIN D2) 50000 units capsule Take 50,000 Units by mouth once a week.   Yes Historical Provider, MD  ferrous sulfate 325 (65 FE) MG EC tablet Take 325 mg by mouth daily.   Yes Historical Provider, MD  FLUoxetine (PROZAC) 40 MG capsule Take 40 mg by mouth daily.   Yes Historical Provider, MD  letrozole (FEMARA) 2.5 MG tablet Take 2.5 mg by mouth daily.   Yes Historical Provider, MD  lisinopril-hydrochlorothiazide (PRINZIDE,ZESTORETIC) 10-12.5 MG tablet Take 1 tablet by mouth daily. 06/09/16  Yes Historical Provider, MD  omeprazole (PRILOSEC) 20 MG capsule Take 40 mg by mouth daily.    Yes Historical Provider, MD  ondansetron (ZOFRAN ODT) 4 MG disintegrating tablet Take 1 tablet (4 mg total) by mouth every 8 (eight) hours as needed for nausea or vomiting. 06/13/16  Yes Gladstone Lighter, MD  QUEtiapine (SEROQUEL) 100 MG tablet Take 200 mg by mouth at bedtime. 03/07/16  Yes Historical Provider, MD  vitamin C (ASCORBIC ACID) 500 MG tablet Take 500  mg by mouth daily.   Yes Historical Provider, MD   Allergies  Allergen Reactions  . Ibuprofen Other (See Comments)    REFLUX  . Nsaids Other (See Comments)    REFLUX [IBUPROFEN]    Social History  Substance Use Topics  . Smoking status: Never Smoker  . Smokeless tobacco: Never Used  . Alcohol use No    Family History  Problem Relation Age of Onset  . Adopted: Yes  . Hypertension Daughter      Review of Systems  Positive ROS: neg  All other systems have been reviewed and were otherwise negative with the exception of those mentioned in the HPI and as above.  Objective: Vital signs in last 24 hours: Temp:  [98.7 F (37.1 C)] 98.7 F (37.1 C) (01/04 0753) Pulse Rate:  [72] 72  (01/04 0753) Resp:  [18] 18 (01/04 0753) BP: (113)/(58) 113/58 (01/04 0753) SpO2:  [100 %] 100 % (01/04 0753) Weight:  [93 kg (205 lb)] 93 kg (205 lb) (01/04 0842)  General Appearance: Alert, cooperative, no distress, appears stated age Head: Normocephalic, without obvious abnormality, atraumatic Eyes: PERRL, conjunctiva/corneas clear, EOM's intact    Neck: Supple, symmetrical, trachea midline Back: Symmetric, no curvature, ROM normal, no CVA tenderness Lungs:  respirations unlabored Heart: Regular rate and rhythm Abdomen: Soft, non-tender Extremities: Extremities normal, atraumatic, no cyanosis or edema Pulses: 2+ and symmetric all extremities Skin: Skin color, texture, turgor normal, no rashes or lesions  NEUROLOGIC:   Mental status: Alert and oriented x4,  no aphasia, good attention span, fund of knowledge, and memory Motor Exam - grossly normal Sensory Exam - grossly normal Reflexes: 1+ Coordination - grossly normal Gait - grossly normal Balance - grossly normal Cranial Nerves: I: smell Not tested  II: visual acuity  OS: nl    OD: nl  II: visual fields Full to confrontation  II: pupils Equal, round, reactive to light  III,VII: ptosis None  III,IV,VI: extraocular muscles  Full ROM  V: mastication Normal  V: facial light touch sensation  Normal  V,VII: corneal reflex  Present  VII: facial muscle function - upper  Normal  VII: facial muscle function - lower Normal  VIII: hearing Not tested  IX: soft palate elevation  Normal  IX,X: gag reflex Present  XI: trapezius strength  5/5  XI: sternocleidomastoid strength 5/5  XI: neck flexion strength  5/5  XII: tongue strength  Normal    Data Review Lab Results  Component Value Date   WBC 3.7 (L) 07/13/2016   HGB 9.5 (L) 07/13/2016   HCT 29.6 (L) 07/13/2016   MCV 85.3 07/13/2016   PLT  07/13/2016    PLATELET CLUMPS NOTED ON SMEAR, COUNT APPEARS DECREASED   Lab Results  Component Value Date   NA 137 07/13/2016   K  4.0 07/13/2016   CL 105 07/13/2016   CO2 21 (L) 07/13/2016   BUN 14 07/13/2016   CREATININE 1.06 (H) 07/13/2016   GLUCOSE 86 07/13/2016   Lab Results  Component Value Date   INR 1.01 07/13/2016    Assessment/Plan: Patient admitted for Chiari I malformation. Plan is for suboccipital decompression with or without duraplasty. Patient has failed a reasonable attempt at conservative therapy.  I explained the condition and procedure to the patient and answered any questions.  Patient wishes to proceed with procedure as planned. Understands risks/ benefits and typical outcomes of procedure.   Krishika Bugge S 07/13/2016 9:35 AM

## 2016-07-13 NOTE — Anesthesia Postprocedure Evaluation (Signed)
Anesthesia Post Note  Patient: April Pham  Procedure(s) Performed: Procedure(s) (LRB): Suboccipital Decompression for Chiari Malformation (N/A)  Patient location during evaluation: PACU Anesthesia Type: General Level of consciousness: awake and alert and oriented Pain management: pain level controlled Vital Signs Assessment: post-procedure vital signs reviewed and stable Respiratory status: spontaneous breathing, nonlabored ventilation, respiratory function stable and patient connected to nasal cannula oxygen Cardiovascular status: blood pressure returned to baseline and stable Postop Assessment: no signs of nausea or vomiting Anesthetic complications: no       Last Vitals:  Vitals:   07/13/16 1322 07/13/16 1330  BP: 115/61   Pulse: 72 72  Resp: 16 14  Temp:      Last Pain:  Vitals:   07/13/16 1330  TempSrc:   PainSc: 4                  Ahlaya Ende A.

## 2016-07-13 NOTE — Anesthesia Preprocedure Evaluation (Addendum)
Anesthesia Evaluation  Patient identified by MRN, date of birth, ID band Patient awake    Reviewed: Allergy & Precautions, NPO status , Patient's Chart, lab work & pertinent test results  Airway Mallampati: II  TM Distance: >3 FB Neck ROM: Full    Dental no notable dental hx.    Pulmonary asthma , pneumonia, resolved,    Pulmonary exam normal breath sounds clear to auscultation       Cardiovascular hypertension, Pt. 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, unsteady gait-uses cane  Neuromuscular disease CVA    GI/Hepatic Neg liver ROS, hiatal hernia, GERD  Medicated and Controlled,  Endo/Other  Hyperlipidemia Obesity Hx/o Breast Ca right S/P lumpectomy and axillary LN dissection and RT  Renal/GU Renal diseaseHx/o renal calculi  negative genitourinary   Musculoskeletal  (+) Arthritis , Osteoarthritis,    Abdominal (+) + obese,   Peds  Hematology  (+) anemia ,   Anesthesia Other Findings   Reproductive/Obstetrics                          Lab Results  Component Value Date   WBC 4.6 06/12/2016   HGB 11.6 (L) 06/12/2016   HCT 35.3 06/12/2016   MCV 81.7 06/12/2016   PLT 204 06/12/2016     Chemistry      Component Value Date/Time   NA 138 06/13/2016 0424   K 4.5 06/13/2016 0424   CL 106 06/13/2016 0424   CO2 25 06/13/2016 0424   BUN 24 (H) 06/13/2016 0424   CREATININE 1.17 (H) 06/13/2016 0424      Component Value Date/Time   CALCIUM 9.2 06/13/2016 0424   ALKPHOS 140 (H) 06/12/2016 1030   AST 26 06/12/2016 1030   ALT 15 06/12/2016 1030   BILITOT 0.7 06/12/2016 1030     EKG: normal sinus rhythm, nonspecific  T wave abnormality.  Anesthesia Physical Anesthesia  Plan  ASA: III  Anesthesia Plan: General   Post-op Pain Management:    Induction: Intravenous  Airway Management Planned: Oral ETT  Additional Equipment:   Intra-op Plan:   Post-operative Plan: Extubation in OR  Informed Consent: I have reviewed the patients History and Physical, chart, labs and discussed the procedure including the risks, benefits and alternatives for the proposed anesthesia with the patient or authorized representative who has indicated his/her understanding and acceptance.   Dental advisory given  Plan Discussed with: Anesthesiologist, CRNA and Surgeon  Anesthesia Plan Comments:         Anesthesia Quick Evaluation

## 2016-07-13 NOTE — Anesthesia Procedure Notes (Signed)
Procedure Name: Intubation Date/Time: 07/13/2016 10:49 AM Performed by: Ollen Bowl Pre-anesthesia Checklist: Patient identified, Emergency Drugs available, Suction available, Patient being monitored and Timeout performed Patient Re-evaluated:Patient Re-evaluated prior to inductionOxygen Delivery Method: Circle system utilized and Simple face mask Preoxygenation: Pre-oxygenation with 100% oxygen Intubation Type: IV induction Ventilation: Mask ventilation without difficulty Laryngoscope Size: Mac and 3 Grade View: Grade I Tube type: Oral Tube size: 7.5 mm Number of attempts: 1 Airway Equipment and Method: Patient positioned with wedge pillow and Stylet Placement Confirmation: ETT inserted through vocal cords under direct vision,  positive ETCO2 and breath sounds checked- equal and bilateral Secured at: 21 cm Tube secured with: Tape Dental Injury: Teeth and Oropharynx as per pre-operative assessment

## 2016-07-13 NOTE — Transfer of Care (Signed)
Immediate Anesthesia Transfer of Care Note  Patient: April Pham  Procedure(s) Performed: Procedure(s): Suboccipital Decompression for Chiari Malformation (N/A)  Patient Location: PACU  Anesthesia Type:General  Level of Consciousness: awake and alert   Airway & Oxygen Therapy: Patient Spontanous Breathing and Patient connected to nasal cannula oxygen  Post-op Assessment: Report given to RN, Post -op Vital signs reviewed and stable and Patient moving all extremities X 4  Post vital signs: Reviewed and stable  Last Vitals:  Vitals:   07/13/16 0753 07/13/16 1252  BP: (!) 113/58 109/66  Pulse: 72   Resp: 18 13  Temp: 37.1 C 36.4 C    Last Pain:  Vitals:   07/13/16 1252  TempSrc:   PainSc: Asleep      Patients Stated Pain Goal: 9 ([phone] [id number])  Complications: No apparent anesthesia complications

## 2016-07-14 ENCOUNTER — Encounter (HOSPITAL_COMMUNITY): Payer: Self-pay | Admitting: Neurological Surgery

## 2016-07-14 MED ORDER — HYDROCODONE-ACETAMINOPHEN 5-325 MG PO TABS
1.0000 | ORAL_TABLET | ORAL | Status: DC | PRN
  Administered 2016-07-14 – 2016-07-17 (×12): 1 via ORAL
  Filled 2016-07-14 (×12): qty 1

## 2016-07-14 NOTE — Evaluation (Signed)
Occupational Therapy Evaluation Patient Details Name: April Pham MRN: [id number] DOB: 06-Oct-1955 Today's Date: 07/14/2016    History of Present Illness Pt admitted for suboccipital decompression of Chiari formation. PMH: breast cancer, arthritis, CKD, asthma, carpal tunnel B, TIAs with right side weakness. H/O behavioral health admission per chart review.    Clinical Impression   Pt is assisted minimally for showering, dresses herself, walks with a walker and participates in IADL at home. She presents with lethargy, generalized weakness, impaired standing balance and limited shoulder ROM due to pain. Will follow acutely. Will follow acutely.    Follow Up Recommendations  Home health OT (depending on progress)   Equipment Recommendations  Tub/shower seat    Recommendations for Other Services       Precautions / Restrictions Precautions Precautions: Fall Restrictions Weight Bearing Restrictions: No      Mobility Bed Mobility Overal bed mobility: Needs Assistance Bed Mobility: Supine to Sit     Supine to sit: Min assist;HOB elevated     General bed mobility comments: increased time, min assist to raise trunk max verbal and tactile cues and increased time to participate due to lethargy, HOB up  Transfers Overall transfer level: Needs assistance Equipment used: Rolling walker (2 wheeled) Transfers: Sit to/from [facility] Sit to Stand: Min assist Stand pivot transfers: Min assist       General transfer comment: assist to rise and to steady    Balance Overall balance assessment: Needs assistance Sitting-balance support: Feet supported Sitting balance-Leahy Scale: Good     Standing balance support: Single extremity supported;During functional activity Standing balance-Leahy Scale: Poor Standing balance comment: able to release walker with one hand to perform pericare and with both hands to brush teeth                             ADL Overall ADL's : Needs assistance/impaired Eating/Feeding: Set up;Sitting   Grooming: Oral care;Sitting;Min guard   Upper Body Bathing: Minimal assistance;Sitting Upper Body Bathing Details (indicate cue type and reason): for back Lower Body Bathing: Minimal assistance;Sit to/from stand Lower Body Bathing Details (indicate cue type and reason): pt applied lotion to B LEs in sitting Upper Body Dressing : Minimal assistance;Sitting   Lower Body Dressing: Minimal assistance;Sit to/from stand Lower Body Dressing Details (indicate cue type and reason): can don and doff socks crossing foot over opposite knee Toilet Transfer: Minimal assistance;Ambulation;BSC;RW   Toileting- Clothing Manipulation and Hygiene: Minimal assistance;Sit to/from stand Toileting - Clothing Manipulation Details (indicate cue type and reason): performed pericare in standing     Functional mobility during ADLs: Minimal assistance;Rolling walker;Cueing for safety (cues to keep eyes open)       Vision     Perception     Praxis      Pertinent Vitals/Pain Pain Assessment: 0-10 Pain Score: 10-Worst pain ever Pain Location: neck Pain Descriptors / Indicators: Guarding Pain Intervention(s): Monitored during session;Premedicated before session;Repositioned     Hand Dominance Right   Extremity/Trunk Assessment Upper Extremity Assessment Upper Extremity Assessment: Defer to OT evaluation RUE Deficits / Details: FF to 90 degrees, then reports pain, WFL elbow to hand RUE Coordination: decreased gross motor LUE Deficits / Details: FF to 90 degrees, then reports pain, WFL elbow to hand LUE Coordination: decreased gross motor   Lower Extremity Assessment Lower Extremity Assessment: Generalized weakness (WFL for ambulation, but difficulty testing in supine due to lethargy)   Cervical / Trunk Assessment Cervical /  Trunk Assessment: Other exceptions Cervical / Trunk Exceptions: flexed and forward head    Communication Communication Communication: Expressive difficulties (mumbling initially; improved when walking with increased level of arousal)   Cognition Arousal/Alertness: Lethargic Behavior During Therapy: Flat affect Overall Cognitive Status: No family/caregiver present to determine baseline cognitive functioning                 General Comments: difficult to assess cognition due to intermittent lethargy   General Comments       Exercises       Shoulder Instructions      Home Living Family/patient expects to be discharged to:: Private residence Living Arrangements: Spouse/significant other (and/or maybe daughter) Available Help at Discharge: Family Type of Home: House Home Access: Stairs to enter Technical brewer of Steps: 4 Entrance Stairs-Rails: Right Home Layout: One level     Bathroom Shower/Tub: Occupational psychologist: Standard (3:1 over)     Home Equipment: Environmental consultant - 2 wheels;Bedside commode          Prior Functioning/Environment Level of Independence: Needs assistance  Gait / Transfers Assistance Needed: walks with RW ADL's / Homemaking Assistance Needed: husband washes her back, pt cooks, pt and husband work together on housekeeping   Comments: Pt with lethargy and inconsistent responsed to PLOF questions.        OT Problem List: Decreased strength;Impaired balance (sitting and/or standing);Decreased activity tolerance;Decreased coordination;Decreased knowledge of use of DME or AE;Decreased cognition;Impaired UE functional use;Pain;Decreased safety awareness;Decreased range of motion   OT Treatment/Interventions: Self-care/ADL training;Therapeutic exercise;DME and/or AE instruction;Therapeutic activities;Patient/family education;Balance training;Cognitive remediation/compensation    OT Goals(Current goals can be found in the care plan section) Acute Rehab OT Goals Patient Stated Goal: return home with husband OT Goal  Formulation: With patient Potential to Achieve Goals: Good ADL Goals Pt Will Perform Grooming: with supervision;standing Pt Will Perform Upper Body Dressing: with supervision;sitting Pt Will Perform Lower Body Dressing: with supervision;sit to/from stand Pt Will Transfer to Toilet: with supervision;ambulating;bedside commode (over toilet) Pt Will Perform Toileting - Clothing Manipulation and hygiene: with supervision;sit to/from stand Pt Will Perform Tub/Shower Transfer: with supervision;ambulating;shower seat;rolling walker  OT Frequency: Min 2X/week   Barriers to D/C:            Co-evaluation PT/OT/SLP Co-Evaluation/Treatment: Yes Reason for Co-Treatment: Necessary to address cognition/behavior during functional activity PT goals addressed during session: Mobility/safety with mobility;Balance OT goals addressed during session: ADL's and self-care      End of Session Equipment Utilized During Treatment: Gait belt;Rolling walker  Activity Tolerance: Patient tolerated treatment well Patient left: in chair;with call bell/phone within reach;with chair alarm set   Time: [id number] OT Time Calculation (min): 27 min Charges:  OT General Charges $OT Visit: 1 Procedure OT Evaluation $OT Eval Moderate Complexity: 1 Procedure G-Codes:    Malka So 07/14/2016, 12:31 PM  [phone]

## 2016-07-14 NOTE — Progress Notes (Signed)
Patient ID: April Pham, female   DOB: [birth year], 61 y.o.   MRN: [id number] Subjective: Patient reports appropriate incisional pain.  Objective: Vital signs in last 24 hours: Temp:  [97.5 F (36.4 C)-99 F (37.2 C)] 98.7 F (37.1 C) (01/05 1022) Pulse Rate:  [62-103] 102 (01/05 1022) Resp:  [13-18] 16 (01/05 1022) BP: (100-132)/(54-86) 126/64 (01/05 1022) SpO2:  [91 %-100 %] 93 % (01/05 1022) Weight:  [94.6 kg (208 lb 9.6 oz)-98.2 kg (216 lb 9.6 oz)] 94.6 kg (208 lb 9.6 oz) (01/05 0339)  Intake/Output from previous day: 01/04 0701 - 01/05 0700 In: 1565 [I.V.:1465; IV Piggyback:100] Out: 1300 [Urine:1275; Blood:25] Intake/Output this shift: Total I/O In: 898.8 [P.O.:120; I.V.:778.8] Out: -   Neurologic: Grossly normal  Lab Results: Lab Results  Component Value Date   WBC 3.7 (L) 07/13/2016   HGB 9.5 (L) 07/13/2016   HCT 29.6 (L) 07/13/2016   MCV 85.3 07/13/2016   PLT  07/13/2016    PLATELET CLUMPS NOTED ON SMEAR, COUNT APPEARS DECREASED   Lab Results  Component Value Date   INR 1.01 07/13/2016   BMET Lab Results  Component Value Date   NA 137 07/13/2016   K 4.0 07/13/2016   CL 105 07/13/2016   CO2 21 (L) 07/13/2016   GLUCOSE 86 07/13/2016   BUN 14 07/13/2016   CREATININE 1.06 (H) 07/13/2016   CALCIUM 9.8 07/13/2016    Studies/Results: Chest 2 View  Result Date: 07/13/2016 CLINICAL DATA:  Preoperative examination prior to surgery for Chiari malformation. History of hypertension, previous CVA, asthma EXAM: CHEST  2 VIEW COMPARISON:  Chest x-ray of December 23, 2015 FINDINGS: The lungs are adequately inflated and clear. There is no significant hemidiaphragm flattening. The heart and pulmonary vascularity are normal. The mediastinum is normal in width. The bony thorax exhibits no acute abnormality. IMPRESSION: There is no active cardiopulmonary disease. Electronically Signed   By: Laken Lobato  [place] M.D.   On: 07/13/2016 08:27    Assessment/Plan: Seems to be making the  expected recovery. Hopefully home tomorrow. Awaiting PT and OT to mobilize   LOS: 1 day    Lyndell Allaire S 07/14/2016, 11:28 AM

## 2016-07-14 NOTE — Evaluation (Signed)
Physical Therapy Evaluation Patient Details Name: April Pham MRN: [id number] DOB: Aug 04, 1955 Today's Date: 07/14/2016   History of Present Illness  Pt admitted for suboccipital decompression of Chiari formation. PMH: breast cancer, arthritis, CKD, asthma, carpal tunnel B, TIAs with right side weakness. H/O behavioral health admission per chart review.   Clinical Impression  Patient presents with limited mobility mainly due to lethargy and pain in addition to other issues listed in PT problem list.  She will benefit from skilled PT in the acute setting to allow return home with family support.  Would recommend at least initial 24 hour assist due to difficulty this session with arousal.  Will follow along.    Follow Up Recommendations Home health PT;Supervision/Assistance - 24 hour    Equipment Recommendations  None recommended by PT    Recommendations for Other Services       Precautions / Restrictions Precautions Precautions: Fall Restrictions Weight Bearing Restrictions: No      Mobility  Bed Mobility Overal bed mobility: Needs Assistance Bed Mobility: Supine to Sit     Supine to sit: Min assist;HOB elevated     General bed mobility comments: increased time, min assist to raise trunk max verbal and tactile cues and increased time to participate due to lethargy, HOB up  Transfers Overall transfer level: Needs assistance Equipment used: Rolling walker (2 wheeled) Transfers: Sit to/from [facility] Sit to Stand: Min assist Stand pivot transfers: Min assist       General transfer comment: assist to rise and to steady  Ambulation/Gait Ambulation/Gait assistance: Min guard Ambulation Distance (Feet): 130 Feet Assistive device: Rolling walker (2 wheeled) Gait Pattern/deviations: Step-through pattern;Decreased stride length     General Gait Details: mild level of impulsivity; turned in hallway without warning, but mostly steady with RW, did need cues  for eyes open; assist for safety  Stairs            Wheelchair Mobility    Modified Rankin (Stroke Patients Only)       Balance Overall balance assessment: Needs assistance Sitting-balance support: Feet supported Sitting balance-Leahy Scale: Good     Standing balance support: Single extremity supported;During functional activity Standing balance-Leahy Scale: Poor Standing balance comment: able to release walker with one hand to perform pericare and with both hands to brush teeth                             Pertinent Vitals/Pain Pain Assessment: 0-10 Pain Score: 10-Worst pain ever Pain Location: neck Pain Descriptors / Indicators: Guarding Pain Intervention(s): Monitored during session;Premedicated before session;Repositioned    Home Living Family/patient expects to be discharged to:: Private residence Living Arrangements: Spouse/significant other (and/or maybe daughter) Available Help at Discharge: Family Type of Home: House Home Access: Stairs to enter Entrance Stairs-Rails: Right Entrance Stairs-Number of Steps: 4 Home Layout: One level Home Equipment: Environmental consultant - 2 wheels;Bedside commode      Prior Function Level of Independence: Needs assistance   Gait / Transfers Assistance Needed: walks with RW  ADL's / Homemaking Assistance Needed: husband washes her back, pt cooks, pt and husband work together on housekeeping  Comments: Pt with lethargy and inconsistent responsed to PLOF questions.     Hand Dominance   Dominant Hand: Right    Extremity/Trunk Assessment   Upper Extremity Assessment Upper Extremity Assessment: Defer to OT evaluation RUE Deficits / Details: FF to 90 degrees, then reports pain, WFL elbow to hand RUE Coordination:  decreased gross motor LUE Deficits / Details: FF to 90 degrees, then reports pain, WFL elbow to hand LUE Coordination: decreased gross motor    Lower Extremity Assessment Lower Extremity Assessment:  Generalized weakness (WFL for ambulation, but difficulty testing in supine due to lethargy)    Cervical / Trunk Assessment Cervical / Trunk Assessment: Other exceptions Cervical / Trunk Exceptions: flexed and forward head  Communication   Communication: Expressive difficulties (mumbling initially; improved when walking with increased level of arousal)  Cognition Arousal/Alertness: Lethargic Behavior During Therapy: Flat affect Overall Cognitive Status: No family/caregiver present to determine baseline cognitive functioning                 General Comments: difficult to assess cognition due to intermittent lethargy    General Comments      Exercises     Assessment/Plan    PT Assessment Patient needs continued PT services  PT Problem List Decreased balance;Pain;Decreased mobility          PT Treatment Interventions DME instruction;Gait training;Therapeutic exercise;Stair training;Functional mobility training;Balance training;Patient/family education;Therapeutic activities    PT Goals (Current goals can be found in the Care Plan section)  Acute Rehab PT Goals Patient Stated Goal: return home with husband PT Goal Formulation: With patient Time For Goal Achievement: 07/21/16 Potential to Achieve Goals: Good    Frequency Min 5X/week   Barriers to discharge        Co-evaluation PT/OT/SLP Co-Evaluation/Treatment: Yes Reason for Co-Treatment: Necessary to address cognition/behavior during functional activity PT goals addressed during session: Mobility/safety with mobility;Balance OT goals addressed during session: ADL's and self-care       End of Session Equipment Utilized During Treatment: Gait belt Activity Tolerance: Patient limited by fatigue Patient left: with call bell/phone within reach;in chair;with chair alarm set           Time: [phone] PT Time Calculation (min) (ACUTE ONLY): 33 min   Charges:   PT Evaluation $PT Eval Moderate Complexity: 1  Procedure     PT G CodesReginia Naas 2016/07/18, 12:01 PM  Magda Kiel, [place] 07-18-16

## 2016-07-15 MED ORDER — HYDROCODONE-ACETAMINOPHEN 5-325 MG PO TABS: 1.0000 | ORAL_TABLET | ORAL | 0 refills | Status: DC | PRN

## 2016-07-15 NOTE — Discharge Summary (Signed)
Physician Discharge Summary  Patient ID: April Pham MRN: [id number] DOB/AGE: Sep 02, 1955 61 y.o.  Admit date: 07/13/2016 Discharge date: 07/15/2016  Admission Diagnoses: Chiari malformation    Discharge Diagnoses: Same   Discharged Condition: good  Hospital Course: The patient was admitted on 07/13/2016 and taken to the operating room where the patient underwent Chiari decompression. The patient tolerated the procedure well and was taken to the recovery room and then to the floor in stable condition. The hospital course was routine. There were no complications. The wound remained clean dry and intact. Pt had appropriate neck soreness. No complaints of arm pain or new N/T/W. The patient remained afebrile with stable vital signs, and tolerated a regular diet. The patient continued to increase activities, and pain was well controlled with oral pain medications.   Consults: None  Significant Diagnostic Studies:  Results for orders placed or performed during the hospital encounter of 07/13/16  Surgical pcr screen  Result Value Ref Range   MRSA, PCR NEGATIVE NEGATIVE   Staphylococcus aureus NEGATIVE NEGATIVE  Basic metabolic panel  Result Value Ref Range   Sodium 137 135 - 145 mmol/L   Potassium 4.0 3.5 - 5.1 mmol/L   Chloride 105 101 - 111 mmol/L   CO2 21 (L) 22 - 32 mmol/L   Glucose, Bld 86 65 - 99 mg/dL   BUN 14 6 - 20 mg/dL   Creatinine, Ser 1.06 (H) 0.44 - 1.00 mg/dL   Calcium 9.8 8.9 - 10.3 mg/dL   GFR calc non Af Amer 55 (L) >60 mL/min   GFR calc Af Amer >60 >60 mL/min   Anion gap 11 5 - 15  CBC WITH DIFFERENTIAL  Result Value Ref Range   WBC 3.7 (L) 4.0 - 10.5 K/uL   RBC 3.47 (L) 3.87 - 5.11 MIL/uL   Hemoglobin 9.5 (L) 12.0 - 15.0 g/dL   HCT 29.6 (L) 36.0 - 46.0 %   MCV 85.3 78.0 - 100.0 fL   MCH 27.4 26.0 - 34.0 pg   MCHC 32.1 30.0 - 36.0 g/dL   RDW 13.3 11.5 - 15.5 %   Platelets  150 - 400 K/uL    PLATELET CLUMPS NOTED ON SMEAR, COUNT APPEARS DECREASED   Neutrophils Relative % 51 %   Neutro Abs 1.9 1.7 - 7.7 K/uL   Lymphocytes Relative 35 %   Lymphs Abs 1.3 0.7 - 4.0 K/uL   Monocytes Relative 9 %   Monocytes Absolute 0.3 0.1 - 1.0 K/uL   Eosinophils Relative 6 %   Eosinophils Absolute 0.2 0.0 - 0.7 K/uL   Basophils Relative 0 %   Basophils Absolute 0.0 0.0 - 0.1 K/uL  Protime-INR  Result Value Ref Range   Prothrombin Time 13.4 11.4 - 15.2 seconds   INR 1.01   Type and screen All Cardiac and thoracic surgeries, spinal fusions, myomectomies, craniotomies, colon & liver resections, total joint revisions, same day c-section with placenta previa or accreta.  Result Value Ref Range   ABO/RH(D) O POS    Antibody Screen NEG    Sample Expiration 07/16/2016   ABO/Rh  Result Value Ref Range   ABO/RH(D) O POS     Chest 2 View  Result Date: 07/13/2016 CLINICAL DATA:  Preoperative examination prior to surgery for Chiari malformation. History of hypertension, previous CVA, asthma EXAM: CHEST  2 VIEW COMPARISON:  Chest x-ray of December 23, 2015 FINDINGS: The lungs are adequately inflated and clear. There is no significant hemidiaphragm flattening. The heart and pulmonary vascularity are  normal. The mediastinum is normal in width. The bony thorax exhibits no acute abnormality. IMPRESSION: There is no active cardiopulmonary disease. Electronically Signed   By: Janey Petron  [place] M.D.   On: 07/13/2016 08:27    Antibiotics:  Anti-infectives    Start     Dose/Rate Route Frequency Ordered Stop   07/13/16 1415  ceFAZolin (ANCEF) IVPB 2g/100 mL premix     2 g 200 mL/hr over 30 Minutes Intravenous Every 8 hours 07/13/16 1409 07/13/16 2207   07/13/16 1205  bacitracin 50,000 Units in sodium chloride irrigation 0.9 % 500 mL irrigation  Status:  Discontinued       As needed 07/13/16 1205 07/13/16 1246   07/13/16 0749  ceFAZolin (ANCEF) IVPB 2g/100 mL premix     2 g 200 mL/hr over 30 Minutes Intravenous On call to O.R. 07/13/16 0749 07/13/16 1051      Discharge  Exam: Blood pressure 132/75, pulse (!) 104, temperature 99.6 F (37.6 C), temperature source Oral, resp. rate 16, height 5\' 6"  (1.676 m), weight 94.6 kg (208 lb 9.6 oz), SpO2 94 %. Neurologic: Grossly normal Dressing dry  Discharge Medications:   Allergies as of 07/15/2016      Reactions   Ibuprofen Other (See Comments)   REFLUX   Nsaids Other (See Comments)   REFLUX [IBUPROFEN]      Medication List    TAKE these medications   acetaminophen 500 MG tablet Commonly known as:  TYLENOL Take 1,000 mg by mouth every 6 (six) hours as needed for headache.   albuterol 108 (90 Base) MCG/ACT inhaler Commonly known as:  PROVENTIL HFA;VENTOLIN HFA Inhale 2 puffs into the lungs every 6 (six) hours as needed for wheezing or shortness of breath.   AMITIZA 24 MCG capsule Generic drug:  lubiprostone Take 24 mcg by mouth 2 (two) times daily.   amitriptyline 50 MG tablet Commonly known as:  ELAVIL Take 1 tablet (50 mg total) by mouth at bedtime.   atorvastatin 10 MG tablet Commonly known as:  LIPITOR Take 1 tablet (10 mg total) by mouth daily at 6 PM.   cetirizine 10 MG tablet Commonly known as:  ZYRTEC Take 10 mg by mouth daily.   ergocalciferol 50000 units capsule Commonly known as:  VITAMIN D2 Take 50,000 Units by mouth once a week.   ferrous sulfate 325 (65 FE) MG EC tablet Take 325 mg by mouth daily.   FLUoxetine 40 MG capsule Commonly known as:  PROZAC Take 40 mg by mouth daily.   HYDROcodone-acetaminophen 5-325 MG tablet Commonly known as:  NORCO/VICODIN Take 1 tablet by mouth every 4 (four) hours as needed for moderate pain.   letrozole 2.5 MG tablet Commonly known as:  FEMARA Take 2.5 mg by mouth daily.   lisinopril-hydrochlorothiazide 10-12.5 MG tablet Commonly known as:  PRINZIDE,ZESTORETIC Take 1 tablet by mouth daily.   omeprazole 20 MG capsule Commonly known as:  PRILOSEC Take 40 mg by mouth daily.   ondansetron 4 MG disintegrating tablet Commonly known  as:  ZOFRAN ODT Take 1 tablet (4 mg total) by mouth every 8 (eight) hours as needed for nausea or vomiting.   QUEtiapine 100 MG tablet Commonly known as:  SEROQUEL Take 200 mg by mouth at bedtime.   vitamin C 500 MG tablet Commonly known as:  ASCORBIC ACID Take 500 mg by mouth daily.       Disposition: home   Final Dx: Chiari decompression  Discharge Instructions    Call MD for:  difficulty  breathing, headache or visual disturbances    Complete by:  As directed    Call MD for:  persistant nausea and vomiting    Complete by:  As directed    Call MD for:  redness, tenderness, or signs of infection (pain, swelling, redness, odor or green/yellow discharge around incision site)    Complete by:  As directed    Call MD for:  severe uncontrolled pain    Complete by:  As directed    Call MD for:  temperature >100.4    Complete by:  As directed    Diet - low sodium heart healthy    Complete by:  As directed    Discharge instructions    Complete by:  As directed    No heavy lifting or driving or strenuous activity, may shower   Incentive spirometry RT    Complete by:  As directed    Increase activity slowly    Complete by:  As directed    Remove dressing in 48 hours    Complete by:  As directed       Follow-up Information    Bronwyn Belasco S, MD. Schedule an appointment as soon as possible for a visit in 2 week(s).   Specialty:  Neurosurgery Contact information: [address] [place] [address] [phone]            Signed: Eustace Moore 07/15/2016, 8:29 AM

## 2016-07-15 NOTE — Progress Notes (Signed)
Patient given discharge instructions along with hardscript.  All questions and concerns addressed.  Patient stated ride will not be here to get her until 6pm.

## 2016-07-15 NOTE — Progress Notes (Signed)
Physical Therapy Treatment Patient Details Name: April Pham MRN: [id number] DOB: 17-Dec-1955 Today's Date: 07/15/2016    History of Present Illness Pt admitted for suboccipital decompression of Chiari formation. PMH: breast cancer, arthritis, CKD, asthma, carpal tunnel B, TIAs with right side weakness. H/O behavioral health admission per chart review.     PT Comments    Lethargy main limiting factor of PT session today. Pt had difficulty remaining engaged in session and reports sudden onset of dizziness which ended gait training after 10'. Upon return to the bed, pt asleep almost immediately. Feel pt could benefit from working with therapy another time prior to d/c. Will continue to follow and return as able to continue PT POC.   Follow Up Recommendations  Home health PT;Supervision/Assistance - 24 hour     Equipment Recommendations  None recommended by PT    Recommendations for Other Services       Precautions / Restrictions Precautions Precautions: Fall Restrictions Weight Bearing Restrictions: No    Mobility  Bed Mobility Overal bed mobility: Needs Assistance Bed Mobility: Sit to Supine       Sit to supine: Mod assist;+2 for safety/equipment   General bed mobility comments: Pt was cued for log roll technique however was attempting to lay straight back on the bed. Mod assist +2 was provided for safe return to supine.   Transfers Overall transfer level: Needs assistance Equipment used: Rolling walker (2 wheeled) Transfers: Sit to/from Stand Sit to Stand: Min assist Stand pivot transfers: Supervision       General transfer comment: increased time to rise and steady but pt. was able to achieve with min assist for balance support.   Ambulation/Gait Ambulation/Gait assistance: Min guard Ambulation Distance (Feet): 10 Feet Assistive device: Rolling walker (2 wheeled) Gait Pattern/deviations: Step-through pattern;Decreased stride length Gait velocity:  Decreased Gait velocity interpretation: Below normal speed for age/gender General Gait Details: Pt reports sudden onset of dizziness and states she needs to sit down immediately. Pt was returned to the bed for seated rest break. When dizziness did not subside, pt was returned to supine.    Stairs            Wheelchair Mobility    Modified Rankin (Stroke Patients Only)       Balance Overall balance assessment: Needs assistance Sitting-balance support: Feet supported Sitting balance-Leahy Scale: Good     Standing balance support: Single extremity supported;During functional activity Standing balance-Leahy Scale: Poor                      Cognition Arousal/Alertness: Awake/alert Behavior During Therapy: WFL for tasks assessed/performed Overall Cognitive Status: Within Functional Limits for tasks assessed                 General Comments: difficult to assess cognition due to intermittent lethargy    Exercises      General Comments        Pertinent Vitals/Pain Pain Assessment: Faces Pain Score: 3  Faces Pain Scale: Hurts even more Pain Location: neck Pain Descriptors / Indicators: Heaviness Pain Intervention(s): Monitored during session    Home Living                      Prior Function            PT Goals (current goals can now be found in the care plan section) Acute Rehab PT Goals Patient Stated Goal: return home with husband PT Goal Formulation: With patient Time  For Goal Achievement: 07/21/16 Potential to Achieve Goals: Good Progress towards PT goals: Progressing toward goals    Frequency    Min 5X/week      PT Plan Current plan remains appropriate    Co-evaluation             End of Session Equipment Utilized During Treatment: Gait belt Activity Tolerance: Patient limited by fatigue Patient left: in bed;with call bell/phone within reach;with bed alarm set     Time: 1147-1205 PT Time Calculation (min)  (ACUTE ONLY): 18 min  Charges:  $Gait Training: 8-22 mins                    G Codes:      Thelma Comp 01-Aug-2016, 2:45 PM   Rolinda Roan, PT, DPT Acute Rehabilitation Services Pager: [phone]

## 2016-07-15 NOTE — Discharge Instructions (Signed)
Cervical Spine Fracture, Stable A cervical spine fracture is a break or crack in one of the bones of the neck. If there is a very low risk of problems happening during healing, the fracture is considered stable. What are the causes? This condition may be caused by:  Motor vehicle accidents.  Injuries from sports such as diving, football, biking, wrestling, or skiing.  Severe osteoporosis or other bone diseases, such as cancers that spread to bone or metabolic abnormalities that cause bone weakness. What are the signs or symptoms? Symptoms of this condition include:  Severe neck pain after an accident or fall. Pain may spread down the shoulders or arms.  Bruising or swelling on the back of the neck.  Numbness, tingling, sudden muscle tightening (spasms), or weakness in the arms, legs, or both. How is this diagnosed? This condition may be diagnosed based on:  Your medical history.  A physical exam of your neck, arms, and legs.  Imaging studies of the neck, such as:  X-rays.  CT scan.  MRI. How is this treated? This condition is treated with a neck brace or cervical collar to keep your neck from moving during the healing process. A cervical collar is a device that supports your chin and the back of your head. You may also be given medicine to help relieve pain. Follow these instructions at home: If you have a neck brace:  Wear the brace as told by your health care provider. Remove it only as told by your health care provider.  If you experience numbness or tingling, loosen the brace.  Keep the brace clean.  If the brace is not waterproof:  Do not let it get wet.  Cover it with a watertight covering when you take a bath or a shower. If you have a cervical collar:  Do not remove the collar unless your health care provider tells you to do this. If you are allowed to remove the collar for cleaning and bathing:  Follow your health care providers instructions about how to  safely take off the collar.  Wash and thoroughly dry the skin on your neck. Check your skin for irritation or sores. If you see any, tell your health care provider.  Ask your health care provider before making any adjustments to your collar. Small adjustments may be needed over time to improve comfort and reduce pressure on your chin or on the back of your head.  Keep long hair outside of the collar.  Keep your collar clean by wiping it with mild soap and water and letting it air-dry completely. The pads can be hand-washed with soap and water and air-dried completely. Managing pain, stiffness, and swelling  If directed, put ice on the injured area:  If you have a removable brace or cervical collar, remove it as told by your health care provider.  Put ice in a plastic bag.  Place a towel between your skin and the bag.  Leave the ice on for 20 minutes, 2-3 times a day. Activity  Do not drive a car until your health care provider approves.  Do not drive or use heavy machinery while taking prescription pain medicine.  Avoid physical activity for as long as directed. Ask your health care provider what activities are safe for you. General instructions  Take over-the-counter and prescription medicines only as told by your health care provider.  Do not take baths, swim, or use a hot tub until your health care provider approves. Ask your health care  provider if you can take showers. You may only be allowed to take sponge baths for bathing.  Do not use any products that contain nicotine or tobacco, such as cigarettes and e-cigarettes. These can delay bone healing. If you need help quitting, ask your health care provider.  Keep all follow-up visits as told by your health care provider. This is important to help prevent long-term (chronic) or permanent injury, pain, and disability. You may need to have follow-up X-rays or MRI 1-3 weeks after your injury. Contact a health care provider  if:  You have irritation or sores on your skin from your brace or cervical collar. Get help right away if:  You have neck pain that gets worse.  You develop difficulties swallowing or breathing.  You develop swelling in your neck.  You have any of the following problems in your arms, legs, or both:  Numbness.  Weakness.  Burning pain.  Movement problems.  You are unable to control when you urinate or have a bowel movement (incontinence).  You have problems with coordination or difficulty walking. This information is not intended to replace advice given to you by your health care provider. Make sure you discuss any questions you have with your health care provider. Document Released: 05/13/2004 Document Revised: 03/30/2016 Document Reviewed: 03/30/2016 Elsevier Interactive Patient Education  2017 [employer].

## 2016-07-15 NOTE — Progress Notes (Signed)
Patients daughter Lucinda Dell in  And states  She does not feel comfortable taking patient home because patient lives by herself and  Needs a lot of help with ADL's. Elmo Putt requested that M.D be called to make aware. Dr Dittyed on call made aware and advised that issue be addressed in the morning.

## 2016-07-15 NOTE — Care Management Note (Signed)
Case Management Note  Patient Details  Name: April Pham MRN: [id number] Date of Birth: 1955/08/11  Subjective/Objective:                  suboccipital decompression of Chiari formation Action/Plan: Discharge planning Expected Discharge Date:  07/15/16               Expected Discharge Plan:  Home/Self Care  In-House Referral:     Discharge planning Services  CM Consult  Post Acute Care Choice:  Home Health Choice offered to:  Patient  DME Arranged:  3-N-1, Walker rolling, Shower stool DME Agency:  [place]:  PT, OT [place] Agency:  NA, Other - See comment  Status of Service:  Completed, signed off  If discussed at [place] of Stay Meetings, dates discussed:    Additional Comments: CM spoke with pt and explained her condition does not meet Medicaid criteria to cover HHPT/OT and it would be an out-of-pocket expense of approx 150/hr.  Pt declines all [place] services.  CM notified [place] DME rep, Reggie to please deliver rolling walker, 3n1 and shower stool to the room prior to discharge.  No other CM needs were communicated. Dellie Catholic, RN 07/15/2016, 11:31 AM

## 2016-07-15 NOTE — Progress Notes (Signed)
Occupational Therapy Treatment Patient Details Name: Nitza Bacik MRN: [id number] DOB: Nov 18, 1955 Today's Date: 07/15/2016    History of present illness Pt admitted for suboccipital decompression of Chiari formation. PMH: breast cancer, arthritis, CKD, asthma, carpal tunnel B, TIAs with right side weakness. H/O behavioral health admission per chart review.    OT comments  Pt. Making gains able to complete bed mobility, grooming tasks, clothing retrieval, and LB dressing at S level of assist. Reports husband available to assist as needed.  Note d/c likely later today.    Follow Up Recommendations  Home health OT    Equipment Recommendations  Tub/shower seat    Recommendations for Other Services      Precautions / Restrictions Precautions Precautions: Fall       Mobility Bed Mobility Overal bed mobility: Modified Independent Bed Mobility: Supine to Sit           General bed mobility comments: hob elevated to simulate her stacked pillows at home, no rails able to guide B LEs and trunk without physical assistance  Transfers Overall transfer level: Needs assistance Equipment used: Rolling walker (2 wheeled) Transfers: Sit to/from Stand Sit to Stand: Min guard Stand pivot transfers: Supervision       General transfer comment: increased time to rise and steady but pt. was able to achieve without physical assistance    Balance                                   ADL       Grooming: Wash/dry hands;Wash/dry face;Supervision/safety;Standing               Lower Body Dressing: Supervision/safety Lower Body Dressing Details (indicate cue type and reason): able to don underwear initially seated, then stood to pull up over hips     Toileting- Clothing Manipulation and Hygiene: Supervision/safety Toileting - Clothing Manipulation Details (indicate cue type and reason): simulated during in room activities and amb. to recliner     Functional mobility  during ADLs: Supervision/safety General ADL Comments: reports husband available to assist as needed      Vision                     Perception     Praxis      Cognition   Behavior During Therapy: [facility] for tasks assessed/performed Overall Cognitive Status: Within Functional Limits for tasks assessed                       Extremity/Trunk Assessment               Exercises     Shoulder Instructions       General Comments      Pertinent Vitals/ Pain       Pain Assessment: 0-10 Pain Score: 3  Pain Location: neck Pain Descriptors / Indicators: Heaviness  Home Living                                          Prior Functioning/Environment              Frequency  Min 2X/week        Progress Toward Goals  OT Goals(current goals can now be found in the care plan section)        Plan  Discharge plan remains appropriate    Co-evaluation                 End of Session Equipment Utilized During Treatment: Gait belt;Rolling walker   Activity Tolerance Patient tolerated treatment well   Patient Left in chair;with call bell/phone within reach;with chair alarm set   Nurse Communication          Time: [id number] OT Time Calculation (min): 13 min  Charges: OT General Charges $OT Visit: 1 Procedure OT Treatments $Self Care/Home Management : 8-22 mins  Janice Coffin, COTA/L 07/15/2016, 10:55 AM

## 2016-07-15 NOTE — Progress Notes (Signed)
Patient stated daughter will pick up around 6pm today.

## 2016-07-16 NOTE — Progress Notes (Signed)
Pt seen and examined. No issues overnight. Patient states she and her family do not feel like it is safe for her to go home.   EXAM: Temp:  [98.3 F (36.8 C)-100.2 F (37.9 C)] 98.9 F (37.2 C) (01/07 1006) Pulse Rate:  [83-100] 100 (01/07 1006) Resp:  [15-16] 16 (01/07 1006) BP: (98-155)/(52-75) 113/52 (01/07 1006) SpO2:  [93 %-100 %] 98 % (01/07 1006) Intake/Output      01/06 0701 - 01/07 0700 01/07 0701 - 01/08 0700   P.O. 480    I.V. (mL/kg)     IV Piggyback     Total Intake(mL/kg) 480 (5.1)    Urine (mL/kg/hr) 350 (0.2)    Total Output 350     Net +130          Urine Occurrence 3 x     Awake and alert Follows commands throughout Full strength  Stable Social work consult for placement

## 2016-07-16 NOTE — Progress Notes (Signed)
Physical Therapy Treatment Patient Details Name: April Pham MRN: [id number] DOB: 07/25/1955 Today's Date: 07/16/2016    History of Present Illness Pt admitted for suboccipital decompression of Chiari formation. PMH: breast cancer, arthritis, CKD, asthma, carpal tunnel B, TIAs with right side weakness. H/O behavioral health admission per chart review.     PT Comments    Pt is not safe to d/c home. Per eval, pt lives at home with husband. Pt now reporting she does not live with her husband. She would be home alone. D/C recommendation updated to SNF.  Follow Up Recommendations  SNF     Equipment Recommendations  None recommended by PT    Recommendations for Other Services       Precautions / Restrictions Precautions Precautions: Fall Restrictions Weight Bearing Restrictions: No    Mobility  Bed Mobility         Supine to sit: HOB elevated;Mod assist     General bed mobility comments: +rail, verbal cues for sequencing  Transfers   Equipment used: Rolling walker (2 wheeled)   Sit to Stand: Min assist Stand pivot transfers: Min guard       General transfer comment: increased time to complete, verbal cues for hand placement and sequencing  Ambulation/Gait Ambulation/Gait assistance: Min guard Ambulation Distance (Feet): 50 Feet Assistive device: Rolling walker (2 wheeled) Gait Pattern/deviations: Step-through pattern;Decreased stride length Gait velocity: Decreased Gait velocity interpretation: Below normal speed for age/gender General Gait Details: Gait distance limited by dizziness/light-headed. Continuous verbal cueing for sequencing and safety.   Stairs            Wheelchair Mobility    Modified Rankin (Stroke Patients Only)       Balance   Sitting-balance support: Feet supported;No upper extremity supported Sitting balance-Leahy Scale: Good     Standing balance support: Bilateral upper extremity supported;During functional  activity Standing balance-Leahy Scale: Poor                      Cognition Arousal/Alertness: Awake/alert Behavior During Therapy: WFL for tasks assessed/performed Overall Cognitive Status: Impaired/Different from baseline Area of Impairment: Problem solving;Safety/judgement;Attention   Current Attention Level: Selective     Safety/Judgement: Decreased awareness of safety   Problem Solving: Slow processing;Requires verbal cues General Comments: cueing required to stay on task    Exercises      General Comments        Pertinent Vitals/Pain Pain Assessment: Faces Faces Pain Scale: Hurts even more Pain Location: neck Pain Descriptors / Indicators: Sore;Numbness;Guarding;Grimacing Pain Intervention(s): Monitored during session;Repositioned    Home Living                      Prior Function            PT Goals (current goals can now be found in the care plan section) Acute Rehab PT Goals Patient Stated Goal: not stated PT Goal Formulation: With patient Time For Goal Achievement: 07/21/16 Potential to Achieve Goals: Good Progress towards PT goals: Progressing toward goals (slowly)    Frequency    Min 5X/week      PT Plan Discharge plan needs to be updated    Co-evaluation             End of Session Equipment Utilized During Treatment: Gait belt Activity Tolerance: Patient limited by lethargy;Treatment limited secondary to medical complications (Comment) (dizziness) Patient left: in chair;with chair alarm set;with call bell/phone within reach     Time: 0900-0926 PT  Time Calculation (min) (ACUTE ONLY): 26 min  Charges:  $Gait Training: 8-22 mins $Therapeutic Activity: 8-22 mins                    G Codes:      April Pham 07/16/2016, 9:41 AM

## 2016-07-17 NOTE — Progress Notes (Signed)
Pts family has now decided to take patient home. CM spoke with Ms Derossett and she assures CM that her daughter is coming to pick her up. Pt has DME ordered at the bedside. Pt does not qualify under Medicaid guidelines for [facility] services. Pt aware.

## 2016-07-17 NOTE — Clinical Social Work Note (Signed)
Clinical Social Work Assessment  Patient Details  Name: April Pham MRN: [id number] Date of Birth: February 01, 1956  Date of referral:  07/16/16               Reason for consult:  Facility Placement                Permission sought to share information with:    Permission granted to share information::  Yes, Verbal Permission Granted  Name::     Wyvonnia Dusky  Agency::     Relationship::  Daughter  Contact Information:  [phone]  Housing/Transportation Living arrangements for the past 2 months:  Single Family Home Source of Information:  Patient Patient Interpreter Needed:  None Criminal Activity/Legal Involvement Pertinent to Current Situation/Hospitalization:  No - Comment as needed Significant Relationships:  Adult Children Lives with:  Self Do you feel safe going back to the place where you live?  No (Patient reports that she cannot care for herself at home alone at discharge and wants to discharge to a skilled facility) Need for family participation in patient care:  Yes (Comment) (Patient wants daughter involved in post-discharge plans)  Care giving concerns:  Patient reported that she lives alone and cannot care for herself at discharge.    Social Worker assessment / plan:  CSW talked with patient at discharge regarding discharge plan, and what Medicaid will pay for in terms of going to a skilled facility for ST rehab. Patient was sitting up in a chair at bedside and was receptive to talking with CSW. It was noted that patient appeared to be in physical discomfort while talking with CSW.  Ms. Riner explained that she lives alone and that neither of her daughters live in [place]. Her daughter Estill Batten lives in [place] and her other daughter lives in [place].    CSW talked with patient and daughter Estill Batten by phone (at patient's request) while in room with Ms. Inboden and explained what Medicaid would cover in a facility. Patient and her daughter understand that Medicaid will not  cover PT/OT services at the facility, only nursing services; but would still like her to go if a facility will accept her.  Employment status:  Disabled (Comment on whether or not currently receiving Disability) Insurance information:  Medicaid [place] PT Recommendations:  [place] / Referral to community resources:  [place] (CSW reviewed SNF list with patient)  Patient/Family's Response to care:  No concerns expressed by patient regarding care during hospitalization.  Patient/Family's Understanding of and Emotional Response to Diagnosis, Current Treatment, and Prognosis:  Not discussed.  Emotional Assessment Appearance:  Appears stated age Attitude/Demeanor/Rapport:  Other (Appropriate) Affect (typically observed):  Appropriate Orientation:  Oriented to Self, Oriented to Place, Oriented to  Time, Oriented to Situation Alcohol / Substance use:  Never Used Psych involvement (Current and /or in the community):  No (Comment)  Discharge Needs  Concerns to be addressed:  Discharge Planning Concerns Readmission within the last 30 days:  Yes Current discharge risk:  Inadequate Financial Supports Barriers to Discharge:  Other (Medicaid only)   Sable Feil, LCSW 07/17/2016, 1:41 PM

## 2016-07-17 NOTE — Care Management Note (Signed)
Case Management Note  Patient Details  Name: April Pham MRN: [id number] Date of Birth: 16-Mar-1956  Subjective/Objective:                    Action/Plan: Pt discharging to [facility] and rehab. No further needs per CM.   Expected Discharge Date:   (Pending)               Expected Discharge Plan:  Home/Self Care  In-House Referral:     Discharge planning Services  CM Consult  Post Acute Care Choice:  Home Health Choice offered to:  Patient  DME Arranged:  3-N-1, Walker rolling, Shower stool DME Agency:     HH Arranged:    [place] Agency:  NA, Other - See comment  Status of Service:  Completed, signed off  If discussed at [place] of Stay Meetings, dates discussed:    Additional Comments:  Pollie Friar, RN 07/17/2016, 4:34 PM

## 2016-07-17 NOTE — NC FL2 (Signed)
[place] MEDICAID FL2 LEVEL OF CARE SCREENING TOOL     IDENTIFICATION  Patient Name: April Pham Birthdate: 01-15-1956 Sex: female Admission Date (Current Location): 07/13/2016  [facility] and [place] Number:  [employer] and Address:  [facility] [place], [place] [address] [place], [place] [address]      Provider Number: [id number]  Attending Physician Name and Address:  Eustace Moore, MD  Relative Name and Phone Number:  Turnbull,Alecia-Daughter; [phone].   Spanier,Lewis-Spouse; [phone]       Current Level of Care: Hospital Recommended Level of Care: [place] Prior Approval Number:    Date Approved/Denied:   PASRR Number: [id number] (Eff. 03/07/13. In [place] MUST as Aline August)  Discharge Plan: SNF    Current Diagnoses: Patient Active Problem List   Diagnosis Date Noted  . 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 Acute gastritis without hemorrhage 06/12/2016  . Dyslipidemia 06/09/2016  . MDD (major depressive disorder) 06/08/2016  . Cerebrovascular disease 06/08/2016  . Headache (Cerebellar tonsillar ectopia) 05/24/2016  . Essential hypertension 05/24/2016  . Normochromic normocytic anemia 05/24/2016  . History of breast cancer 05/24/2016    Orientation RESPIRATION BLADDER Height & Weight     Self, Time, Situation, Place  Normal Continent Weight: 208 lb 9.6 oz (94.6 kg) Height:  5\' 6"  (167.6 cm)  BEHAVIORAL SYMPTOMS/MOOD NEUROLOGICAL BOWEL NUTRITION STATUS      Continent Diet (Regular)  AMBULATORY STATUS COMMUNICATION OF NEEDS Skin   Limited Assist Verbally Normal                       Personal Care Assistance Level of Assistance  Bathing, Feeding, Dressing Bathing Assistance: Limited assistance Feeding assistance: Independent (assistance with set-up) Dressing Assistance: Limited assistance     Functional Limitations Info  Sight, Hearing, Speech  Sight Info: Adequate Hearing Info: Adequate Speech Info: Adequate    SPECIAL CARE FACTORS FREQUENCY  PT (By licensed PT), OT (By licensed OT)     PT Frequency: Evaluated 1/5 and a minimum of 5X per week therapy recommended OT Frequency: Evaluated 1/5 and a minimum of 2X per week therapy recommended            Contractures Contractures Info: Not present    Additional Factors Info  Code Status, Allergies Code Status Info: Full Allergies Info: Ibuprofen, Nsaids           Current Medications (07/17/2016):  This is the current hospital active medication list Current Facility-Administered Medications  Medication Dose Route Frequency Provider Last Rate Last Dose  . 0.9 %  sodium chloride infusion  250 mL Intravenous Continuous Eustace Moore, MD      . 0.9 % NaCl with KCl 20 mEq/ L  infusion   Intravenous Continuous Eustace Moore, MD   Stopped at 07/14/16 [phone]  . acetaminophen (TYLENOL) tablet 650 mg  650 mg Oral Q4H PRN Eustace Moore, MD   650 mg at 07/15/16 1756   Or  . acetaminophen (TYLENOL) suppository 650 mg  650 mg Rectal Q4H PRN Eustace Moore, MD      . acetaminophen (TYLENOL) tablet 1,000 mg  1,000 mg Oral Q6H PRN Kary Kos, MD   1,000 mg at 07/17/16 0058  . albuterol (PROVENTIL) (2.5 MG/3ML) 0.083% nebulizer solution 2.5 mg  2.5 mg Inhalation Q6H PRN Kary Kos, MD      . amitriptyline (ELAVIL) tablet 50 mg  50 mg  Oral QHS Kary Kos, MD   50 mg at 07/16/16 2121  . atorvastatin (LIPITOR) tablet 10 mg  10 mg Oral q1800 Kary Kos, MD   10 mg at 07/16/16 1657  . ferrous sulfate tablet 325 mg  325 mg Oral Daily Kary Kos, MD   325 mg at 07/17/16 0913  . FLUoxetine (PROZAC) capsule 40 mg  40 mg Oral Daily Kary Kos, MD   40 mg at 07/17/16 0913  . lisinopril (PRINIVIL,ZESTRIL) tablet 10 mg  10 mg Oral Daily Eustace Moore, MD   10 mg at 07/17/16 0913   And  . hydrochlorothiazide (MICROZIDE) capsule 12.5 mg  12.5 mg Oral Daily Eustace Moore, MD   12.5 mg at 07/17/16 0913  .  HYDROcodone-acetaminophen (NORCO/VICODIN) 5-325 MG per tablet 1 tablet  1 tablet Oral Q4H PRN Eustace Moore, MD   1 tablet at 07/17/16 0913  . letrozole [facility]) tablet 2.5 mg  2.5 mg Oral Daily Kary Kos, MD   2.5 mg at 07/17/16 0913  . loratadine (CLARITIN) tablet 10 mg  10 mg Oral Daily Kary Kos, MD   10 mg at 07/17/16 0913  . lubiprostone (AMITIZA) capsule 24 mcg  24 mcg Oral BID Kary Kos, MD   24 mcg at 07/17/16 0913  . menthol-cetylpyridinium (CEPACOL) lozenge 3 mg  1 lozenge Oral PRN Eustace Moore, MD       Or  . phenol [facility]) mouth spray 1 spray  1 spray Mouth/Throat PRN Eustace Moore, MD   1 spray at 07/14/16 [phone]  . methocarbamol (ROBAXIN) tablet 500 mg  500 mg Oral Q6H PRN Eustace Moore, MD   500 mg at 07/17/16 0913   Or  . methocarbamol (ROBAXIN) 500 mg in dextrose 5 % 50 mL IVPB  500 mg Intravenous Q6H PRN Eustace Moore, MD      . ondansetron [facility]) injection 4 mg  4 mg Intravenous Q4H PRN Eustace Moore, MD   4 mg at 07/13/16 2238  . ondansetron (ZOFRAN-ODT) disintegrating tablet 4 mg  4 mg Oral Q8H PRN Kary Kos, MD   4 mg at 07/14/16 2213  . pantoprazole (PROTONIX) EC tablet 40 mg  40 mg Oral Daily Kary Kos, MD   40 mg at 07/17/16 0913  . QUEtiapine (SEROQUEL) tablet 200 mg  200 mg Oral QHS Kary Kos, MD   200 mg at 07/16/16 2121  . senna (SENOKOT) tablet 8.6 mg  1 tablet Oral BID Eustace Moore, MD   8.6 mg at 07/17/16 0913  . sodium chloride flush (NS) 0.9 % injection 3 mL  3 mL Intravenous Q12H Eustace Moore, MD   3 mL at 07/15/16 2200  . sodium chloride flush (NS) 0.9 % injection 3 mL  3 mL Intravenous PRN Eustace Moore, MD      . vitamin C (ASCORBIC ACID) tablet 500 mg  500 mg Oral Daily Kary Kos, MD   500 mg at 07/17/16 0913  . Vitamin D (Ergocalciferol) (DRISDOL) capsule 50,000 Units  50,000 Units Oral Weekly Kary Kos, MD         Discharge Medications: Please see discharge summary for a list of discharge medications.  Relevant Imaging Results:  Relevant  Lab Results:   Additional Information [id number]  Sable Feil, LCSW

## 2016-07-17 NOTE — Progress Notes (Signed)
Notified patient's MD of family's intent to take patient home because of insurance non-payment for rehab.  Patient still to leave tonight.  Attempted to call patient's daughter to ask about approximate arrival time. No answer and mailbox full. Wendee Copp

## 2016-07-17 NOTE — Discharge Summary (Signed)
Discharge summary addendum:  Patient did not feel comfortable with discharge home.  She had no help at home.  Her pain required IV medications.  She was still somewhat lethargic and not steady on her feet.  Therefore her discharge was canceled.  Skilled nursing facility orders were requested.  She will be discharged to a skilled nursing facility today with plans to follow up in 2 weeks.

## 2016-07-17 NOTE — Clinical Social Work Placement (Addendum)
   CLINICAL SOCIAL WORK PLACEMENT  NOTE 07/17/16 - PATIENT WILL DISCHARGE HOME - SEE NOTE  Date:  07/17/2016  Patient Details  Name: April Pham MRN: [id number] Date of Birth: [birth year]  Clinical Social Work is seeking post-discharge placement for this patient at the [place] level of care (*CSW will initial, date and re-position this form in  chart as items are completed):  No (Talked with patient regarding facilities in [facility] with list present)   Patient/family provided with [place] Work Department's list of facilities offering this level of care within the geographic area requested by the patient (or if unable, by the patient's family).  Yes   Patient/family informed of their freedom to choose among providers that offer the needed level of care, that participate in Medicare, Medicaid or managed care program needed by the patient, have an available bed and are willing to accept the patient.  No   Patient/family informed of [facility]'s ownership interest in [facility] and [facility], as well as of the fact that they are under no obligation to receive care at these facilities.  PASRR submitted to EDS on       PASRR number received on       Existing PASRR number confirmed on 07/17/16     FL2 transmitted to all facilities in geographic area requested by pt/family on 07/17/16     FL2 transmitted to all facilities within larger geographic area on       Patient informed that his/her managed care company has contracts with or will negotiate with certain facilities, including the following:            Patient/family informed of bed offers received.  Patient chooses bed at       Physician recommends and patient chooses bed at      Patient to be transferred to   on  .  Patient to be transferred to facility by       Patient family notified on   of transfer.  Name of family member notified:        PHYSICIAN Please sign FL2      Additional Comment:    _______________________________________________ Sable Feil, LCSW 07/17/2016, 1:53 PM

## 2016-07-17 NOTE — Clinical Social Work Note (Signed)
Patient agreeable to going to [facility] with the understanding that she would not be getting rehab but nursing services. CSW received call from patient's daughter April Pham indicating that they are coming to get patient and they will make arrangements for her as patient will not go to a facility just to lay around since her insurance does not pay for rehab. CSW expressed understanding and advised daughter that skilled facility, MD and nurse would be advised. CSW signing off as patient will discharge home with family making arrangements to assure her safety.  Ahnesty Finfrock Givens, MSW, LCSW Licensed Clinical Social Worker [place] [phone]

## 2016-07-17 NOTE — Progress Notes (Signed)
Physical Therapy Treatment Patient Details Name: April Pham MRN: [id number] DOB: 08-08-1955 Today's Date: 07/17/2016    History of Present Illness Pt admitted for suboccipital decompression of Chiari formation. PMH: breast cancer, arthritis, CKD, asthma, carpal tunnel B, TIAs with right side weakness. H/O behavioral health admission per chart review.     PT Comments    Pt continues to be limited by lethargy this session. Was able to ambulate without complaints of significant dizziness. Pt was educated on precautions and general safety with mobility. Will continue to follow and progress as able per POC.  Follow Up Recommendations  SNF     Equipment Recommendations  None recommended by PT    Recommendations for Other Services       Precautions / Restrictions Precautions Precautions: Fall Restrictions Weight Bearing Restrictions: No    Mobility  Bed Mobility Overal bed mobility: Needs Assistance Bed Mobility: Sit to Supine     Supine to sit: HOB elevated;Mod assist Sit to supine: Mod assist;+2 for safety/equipment   General bed mobility comments: VC's for log roll technique. Pt required assist for trunk elevation to full sitting position.   Transfers Overall transfer level: Needs assistance Equipment used: Rolling walker (2 wheeled) Transfers: Sit to/from Stand Sit to Stand: Min assist         General transfer comment: increased time to complete, verbal cues for hand placement and sequencing  Ambulation/Gait Ambulation/Gait assistance: Min assist Ambulation Distance (Feet): 50 Feet Assistive device: Rolling walker (2 wheeled) Gait Pattern/deviations: Step-through pattern;Decreased stride length Gait velocity: Decreased Gait velocity interpretation: Below normal speed for age/gender General Gait Details: VC's for improved posture, walker management, and eyes open/forward gaze. Pt very sleepy and had a difficult time maintaining open eyes and staying engaged in  the session.    Stairs            Wheelchair Mobility    Modified Rankin (Stroke Patients Only)       Balance Overall balance assessment: Needs assistance Sitting-balance support: Feet supported;No upper extremity supported Sitting balance-Leahy Scale: Good     Standing balance support: Bilateral upper extremity supported;During functional activity Standing balance-Leahy Scale: Poor Standing balance comment: able to release walker with one hand to perform pericare and with both hands to brush teeth                    Cognition Arousal/Alertness: Awake/alert Behavior During Therapy: WFL for tasks assessed/performed Overall Cognitive Status: Impaired/Different from baseline Area of Impairment: Problem solving;Safety/judgement;Attention   Current Attention Level: Selective     Safety/Judgement: Decreased awareness of safety   Problem Solving: Slow processing;Requires verbal cues General Comments: cueing required to stay on task    Exercises      General Comments        Pertinent Vitals/Pain Pain Assessment: Faces Faces Pain Scale: Hurts [place] more Pain Location: neck Pain Descriptors / Indicators: Sore;Numbness;Guarding;Grimacing Pain Intervention(s): Limited activity within patient's tolerance;Monitored during session;Repositioned    Home Living                      Prior Function            PT Goals (current goals can now be found in the care plan section) Acute Rehab PT Goals Patient Stated Goal: not stated PT Goal Formulation: With patient Time For Goal Achievement: 07/21/16 Potential to Achieve Goals: Good Progress towards PT goals: Progressing toward goals    Frequency    Min 5X/week  PT Plan Current plan remains appropriate    Co-evaluation             End of Session Equipment Utilized During Treatment: Gait belt Activity Tolerance: Patient limited by lethargy (dizziness) Patient left: in chair;with  chair alarm set;with call bell/phone within reach     Time: [phone] PT Time Calculation (min) (ACUTE ONLY): 23 min  Charges:  $Gait Training: 23-37 mins                    G Codes:      Thelma Comp 07/20/16, 1:46 PM  Rolinda Roan, PT, DPT Acute Rehabilitation Services Pager: [phone]

## 2016-12-10 IMAGING — US US ABDOMEN COMPLETE
1 series · 13 of 25 positions shown · non-contrast
Comparison: None in PACs

CLINICAL DATA: 24 hours of acute abdominal pain associated with
nausea and vomiting. History of hyperlipidemia, breast malignancy.

EXAM:
ABDOMEN ULTRASOUND COMPLETE

[Series 1: us abdomen complete · 0.25mm/px · 13 of 102 slices shown]
[im 1/102]
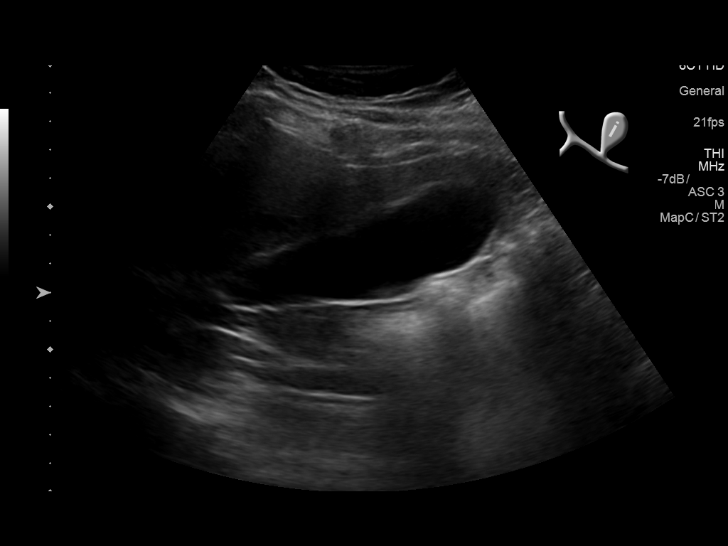
[im 9/102]
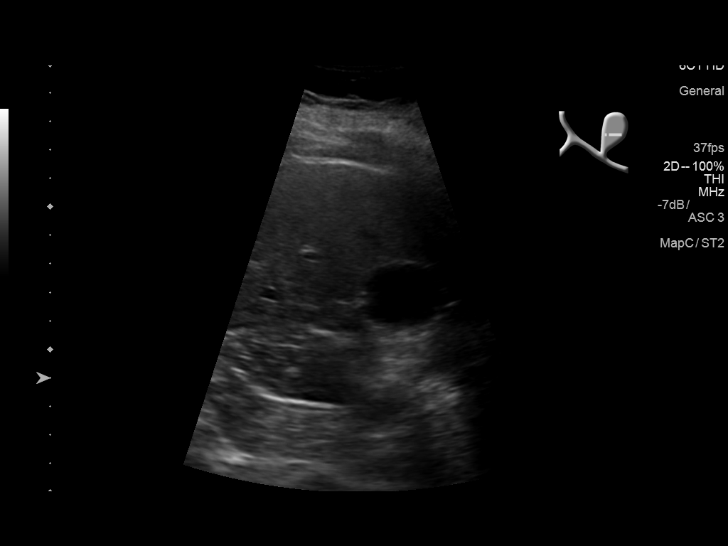
[im 17/102]
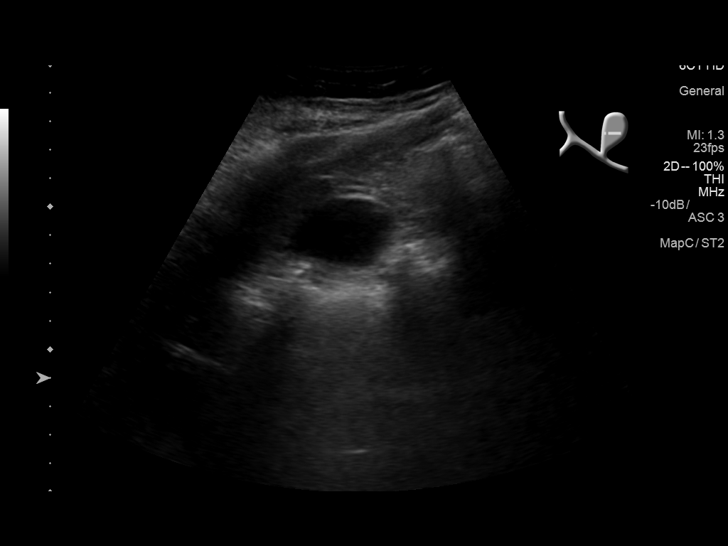
[im 26/102]
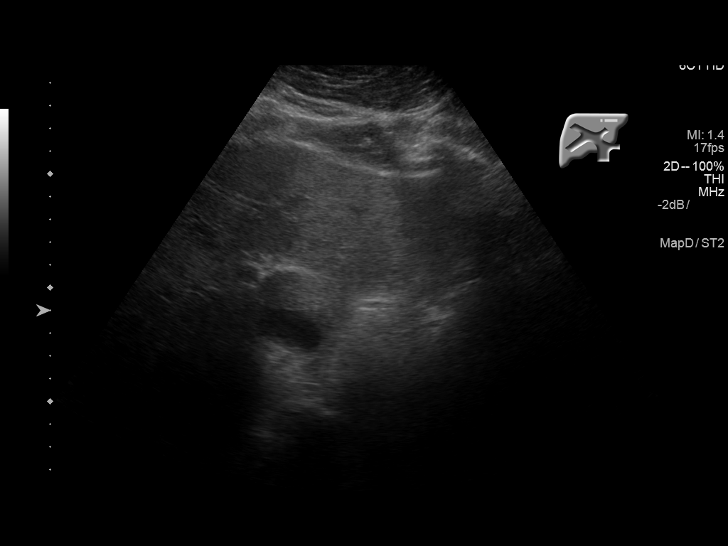
[im 34/102]
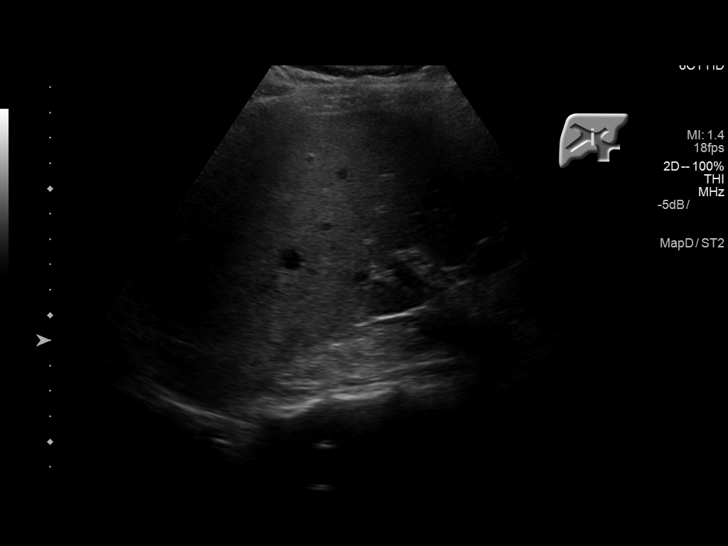
[im 43/102]
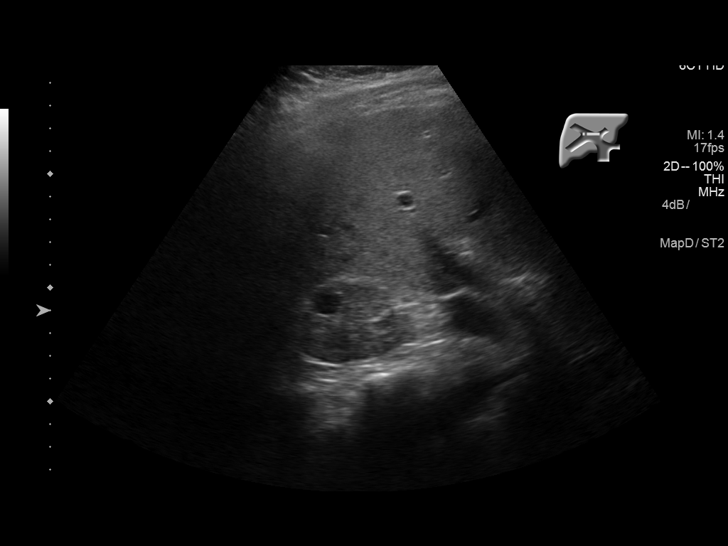
[im 51/102]
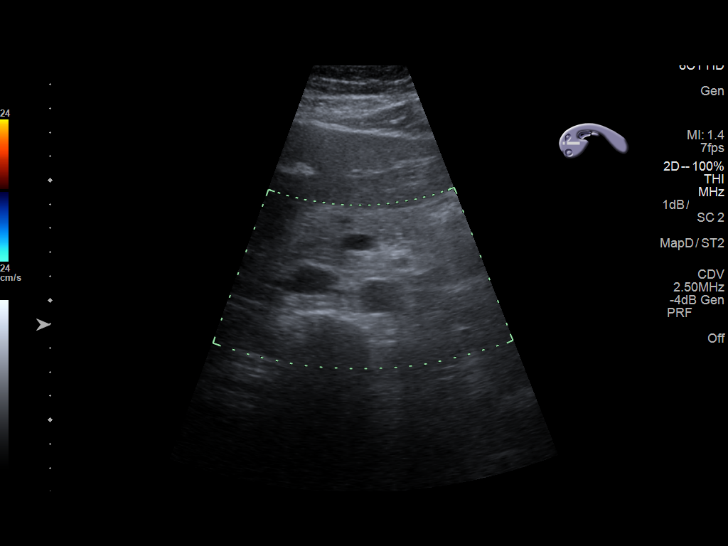
[im 59/102]
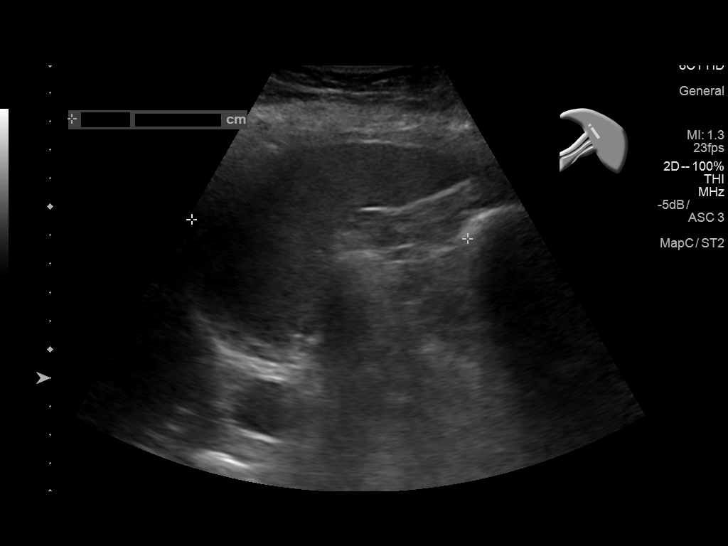
[im 68/102]
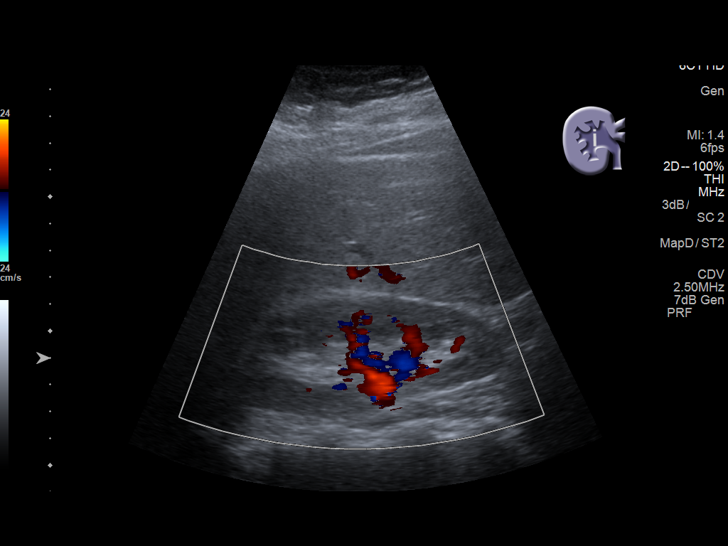
[im 76/102]
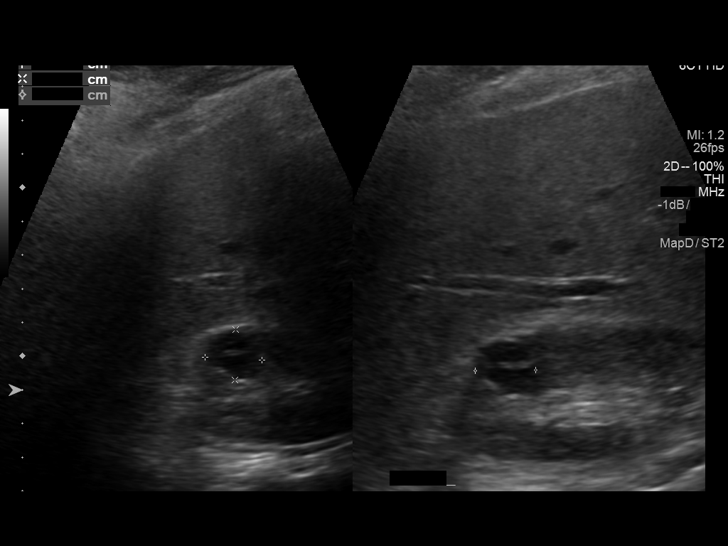
[im 85/102]
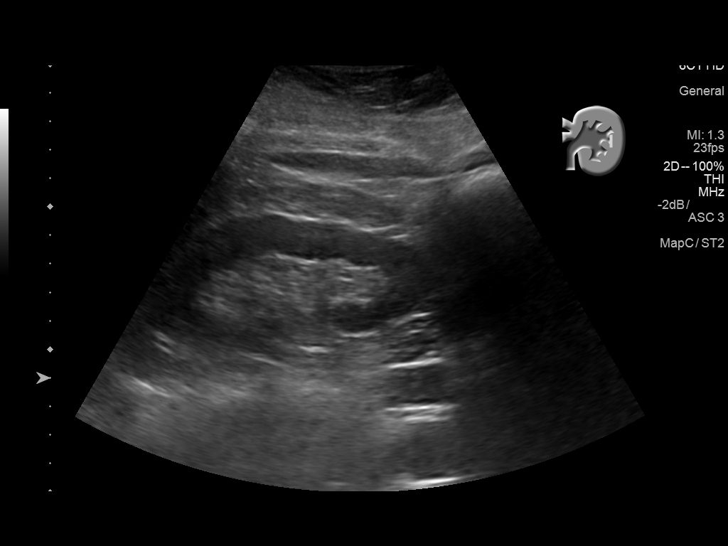
[im 93/102]
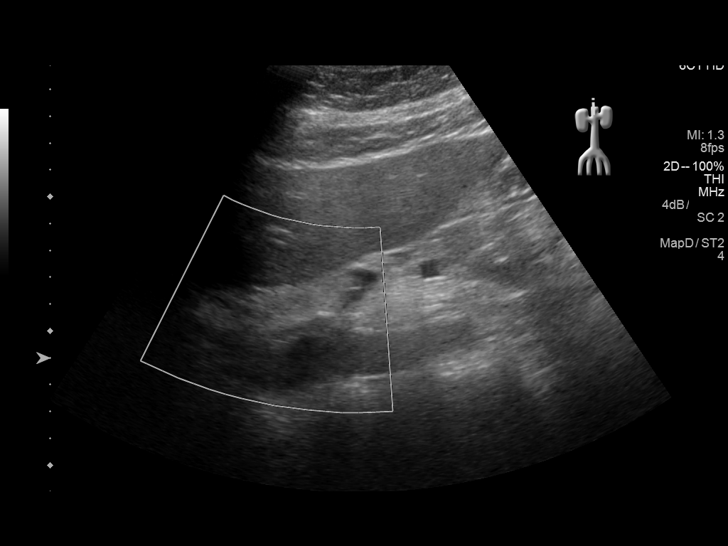
[im 102/102]
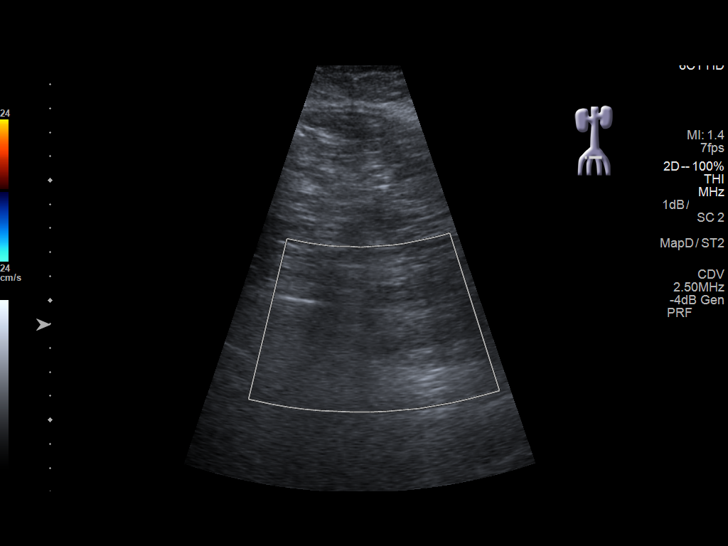

[13 of 25 positions shown; findings below may reference images not displayed]

FINDINGS: Gallbladder: No gallstones or wall thickening visualized. No
sonographic Murphy sign noted by sonographer.

Common bile duct: Diameter: 3.6 mm

Liver: No focal lesion identified. Within normal limits in
parenchymal echogenicity.

IVC: No abnormality visualized.

Pancreas: Visualized portion unremarkable.

Spleen: Size and appearance within normal limits.

Right Kidney: Length: 11.4 cm. The renal cortical echotexture is
normal. There is no hydronephrosis. There is an upper pole septated
cyst measuring 1.7 x 1.5 x 1.6 cm.

Left Kidney: Length: 9.8 cm. Echogenicity within normal limits. No
mass or hydronephrosis visualized.

Abdominal aorta: No aneurysm visualized.

Other findings: There is no ascites.
IMPRESSION: No gallstones or sonographic evidence of acute cholecystitis. If
there are clinical concerns of chronic gallbladder dysfunction, a
nuclear medicine hepatobiliary scan with gallbladder ejection
fraction determination may be useful.

Normal appearance of the liver and spleen.  No ascites.

Septated upper pole cyst in the right kidney measuring 1.7 cm in
greatest dimension. Given the patient's history breast malignancy,
renal protocol MRI is recommended.

## 2018-09-10 ENCOUNTER — Other Ambulatory Visit: Payer: Self-pay | Admitting: Neurological Surgery

## 2018-09-16 NOTE — Pre-Procedure Instructions (Signed)
April Pham  09/16/2018      [id number] April Pham, [place] [id number] Phone: [phone] Fax: [phone]    Your procedure is scheduled on September 23, 2018.  Report to [facility] Entrance "A" at 800 AM.  Call this number if you have problems the morning of surgery:  [phone]   Remember:  Do not eat or drink after midnight.    Take these medicines the morning of surgery with A SIP OF WATER  Venlafaxine (effoxor-XR) Cetirizine (zyrtec) Flovent inhaler Linzess Omeprazole (prilosec) Letrozole (femara) Ondansetron (zofran)-if needed Tylenol-if needed  7 days prior to surgery STOP taking any Aspirin (unless otherwise instructed by your surgeon), Aleve, Naproxen, Ibuprofen, Motrin, Advil, Goody's, BC's, all herbal medications, fish oil, and all vitamins     Do not wear jewelry, make-up or nail polish.  Do not wear lotions, powders, or perfumes, or deodorant.  Do not shave 48 hours prior to surgery.    Do not bring valuables to the hospital.  [facility] is not responsible for any belongings or valuables.  Contacts, dentures or bridgework may not be worn into surgery.  Leave your suitcase in the car.  After surgery it may be brought to your room.  For patients admitted to the hospital, discharge time will be determined by your treatment team.  Patients discharged the day of surgery will not be allowed to drive home.    [place]- Preparing For Surgery  Before surgery, you can play an important role. Because skin is not sterile, your skin needs to be as free of germs as possible. You can reduce the number of germs on your skin by washing with CHG (chlorahexidine gluconate) Soap before surgery.  CHG is an antiseptic cleaner which kills germs and bonds with the skin to continue killing germs even after washing.    Oral Hygiene is also important to reduce your  risk of infection.  Remember - BRUSH YOUR TEETH THE MORNING OF SURGERY WITH YOUR REGULAR TOOTHPASTE  Please do not use if you have an allergy to CHG or antibacterial soaps. If your skin becomes reddened/irritated stop using the CHG.  Do not shave (including legs and underarms) for at least 48 hours prior to first CHG shower. It is OK to shave your face.  Please follow these instructions carefully.   1. Shower the NIGHT BEFORE SURGERY and the MORNING OF SURGERY with CHG.   2. If you chose to wash your hair, wash your hair first as usual with your normal shampoo.  3. After you shampoo, rinse your hair and body thoroughly to remove the shampoo.  4. Use CHG as you would any other liquid soap. You can apply CHG directly to the skin and wash gently with a scrungie or a clean washcloth.   5. Apply the CHG Soap to your body ONLY FROM THE NECK DOWN.  Do not use on open wounds or open sores. Avoid contact with your eyes, ears, mouth and genitals (private parts). Wash Face and genitals (private parts)  with your normal soap.  6. Wash thoroughly, paying special attention to the area where your surgery will be performed.  7. Thoroughly rinse your body with warm water from the neck down.  8. DO NOT shower/wash with your normal soap after using and rinsing off the CHG Soap.  9. Pat yourself dry with a CLEAN TOWEL.  10. Wear CLEAN PAJAMAS to bed the night before surgery, wear comfortable clothes the morning of surgery  11. Place CLEAN SHEETS on your bed the night of your first shower and DO NOT SLEEP WITH PETS.  Day of Surgery:  Do not apply any deodorants/lotions.  Please wear clean clothes to the hospital/surgery center.   Remember to brush your teeth WITH YOUR REGULAR TOOTHPASTE.  Please read over the following fact sheets that you were given.

## 2018-09-17 ENCOUNTER — Other Ambulatory Visit (HOSPITAL_COMMUNITY): Payer: Self-pay

## 2018-09-19 ENCOUNTER — Encounter (HOSPITAL_COMMUNITY): Payer: Self-pay | Admitting: Physician Assistant

## 2018-09-19 ENCOUNTER — Encounter (HOSPITAL_COMMUNITY)
Admission: RE | Admit: 2018-09-19 | Discharge: 2018-09-19 | Disposition: A | Discharge status: Home / Self Care | Payer: Medicaid Other | Source: Ambulatory Visit | Attending: Neurological Surgery | Admitting: Neurological Surgery

## 2018-09-19 ENCOUNTER — Encounter (HOSPITAL_COMMUNITY): Payer: Self-pay

## 2018-09-19 ENCOUNTER — Other Ambulatory Visit: Payer: Self-pay

## 2018-09-19 DIAGNOSIS — Z01812 Encounter for preprocedural laboratory examination: Secondary | ICD-10-CM | POA: Insufficient documentation

## 2018-09-19 HISTORY — DX: Prediabetes: R73.03

## 2018-09-19 HISTORY — DX: Constipation, unspecified: K59.00

## 2018-09-19 HISTORY — DX: Unspecified urinary incontinence: R32

## 2018-09-19 HISTORY — DX: Anxiety disorder, unspecified: F41.9

## 2018-09-19 LAB — BASIC METABOLIC PANEL
Anion gap: 8 (ref 5–15)
BUN: 9 mg/dL (ref 8–23)
CALCIUM: 9.8 mg/dL (ref 8.9–10.3)
CO2: 22 mmol/L (ref 22–32)
CREATININE: 1.02 mg/dL — AB (ref 0.44–1.00)
Chloride: 110 mmol/L (ref 98–111)
GFR calc Af Amer: >60 mL/min (ref >60)
GFR calc non Af Amer: 58 mL/min — ABNORMAL LOW (ref >60)
Glucose, Bld: 92 mg/dL (ref 70–99)
Potassium: 3.9 mmol/L (ref 3.5–5.1)
Sodium: 140 mmol/L (ref 135–145)

## 2018-09-19 LAB — CBC
HCT: 40.2 % (ref 36.0–46.0)
Hemoglobin: 12.2 g/dL (ref 12.0–15.0)
MCH: 26.6 pg (ref 26.0–34.0)
MCHC: 30.3 g/dL (ref 30.0–36.0)
MCV: 87.6 fL (ref 80.0–100.0)
Platelets: 261 10*3/uL (ref 150–400)
RBC: 4.59 MIL/uL (ref 3.87–5.11)
RDW: 13.8 % (ref 11.5–15.5)
WBC: 5.6 10*3/uL (ref 4.0–10.5)
nRBC: 0 % (ref 0.0–0.2)

## 2018-09-19 LAB — GLUCOSE, CAPILLARY: Glucose-Capillary: 88 mg/dL (ref 70–99)

## 2018-09-19 NOTE — Progress Notes (Signed)
PCP - `Dr Florentina Jenny  Cardiologist - no  Chest x-ray - NA  EKG - 08/17/2018 [facility] .  I requested a tracing from [facility].   Stress Test -denies  ECHO - denies  Cardiac Cath - denies  Sleep Study - no CPAP - no  LABS-CBC, BMP  ASA-no  HA1C-07/29/2018- 6.2  Fasting Blood Sugar - doesn't check- has a CBG, not sure where it is. Checks Blood Sugar ____0_ times a day  Anesthesia-Ck was 743- 08/17/2018 when seen in ED at [facility] for muscle weakness. 09/08/2018 CK was 278 in ED at [facility].  I notified Karoline Caldwell, PA-C. Jeneen Rinks asked me to check with patient's PCP to make sure he knows patient has had elevated CK's and that patient needs clearance for surgery on Monday.  I called Dr.Amina Mogul's office and spoke with April. April did not see any mention of elevated CK when patient was seen 08/20/2018. April said that patient will have to be seen for a clearance.  Mrs. Jaskolski was in the office when I spoke with April, she said she can go to an appointment at [facility] on 09/20/2018.  I asked April to fax clearance to [phone] attention Jeneen Rinks.  Pt denies having chest pain, sob, or fever at this time. All instructions explained to the pt, with a verbal understanding of the material. Pt agrees to go over the instructions while at home for a better understanding. The opportunity to ask questions was provided.

## 2018-09-19 NOTE — Progress Notes (Signed)
   How to Manage Your Diabetes Before and After Surgery  Why is it important to control my blood sugar before and after surgery? . Improving blood sugar levels before and after surgery helps healing and can limit problems. . A way of improving blood sugar control is eating a healthy diet by: o  Eating less sugar and carbohydrates o  Increasing activity/exercise o  Talking with your doctor about reaching your blood sugar goals . High blood sugars (greater than 180 mg/dL) can raise your risk of infections and slow your recovery, so you will need to focus on controlling your diabetes during the weeks before surgery. . Make sure that the doctor who takes care of your diabetes knows about your planned surgery including the date and location.  How do I manage my blood sugar before surgery? . Check your blood sugar at least 4 times a day, starting 2 days before surgery, to make sure that the level is not too high or low. o Check your blood sugar the morning of your surgery when you wake up and every 2 hours until you get to the Short Stay unit. . If your blood sugar is less than 70 mg/dL, you will need to treat for low blood sugar: o Do not take insulin. o Treat a low blood sugar (less than 70 mg/dL) with  cup of clear juice (cranberry or apple), 4 glucose tablets, OR glucose gel. Recheck blood sugar in 15 minutes after treatment (to make sure it is greater than 70 mg/dL). If your blood sugar is not greater than 70 mg/dL on recheck, l call [place] for further instructions. o  for further instructions. . Report your blood sugar to the short stay nurse when you get to Short Stay.  . If you are admitted to the hospital after surgery: o Your blood sugar will be checked by the staff and you will probably be given insulin after surgery (instead of oral diabetes medicines) to make sure you have good blood sugar levels. o The goal for blood sugar control after surgery is 80-180 mg/d.  WHAT DO I  DO ABOUT MY DIABETES MEDICATION?  Marland Kitchen Do not take oral diabetes medicines (pills) the morning of surgery.

## 2018-09-19 NOTE — Progress Notes (Signed)
   How to Manage Your Diabetes Before and After Surgery  Why is it important to control my blood sugar before and after surgery? . Improving blood sugar levels before and after surgery helps healing and can limit problems. . A way of improving blood sugar control is eating a healthy diet by: o  Eating less sugar and carbohydrates o  Increasing activity/exercise o  Talking with your doctor about reaching your blood sugar goals . High blood sugars (greater than 180 mg/dL) can raise your risk of infections and slow your recovery, so you will need to focus on controlling your diabetes during the weeks before surgery. . Make sure that the doctor who takes care of your diabetes knows about your planned surgery including the date and location.  How do I manage my blood sugar before surgery? . Check your blood sugar at least 4 times a day, starting 2 days before surgery, to make sure that the level is not too high or low. o Check your blood sugar the morning of your surgery when you wake up and every 2 hours until you get to the Short Stay unit. . If your blood sugar is less than 70 mg/dL, you will need to treat for low blood sugar: o Do not take insulin. o Treat a low blood sugar (less than 70 mg/dL) with  cup of clear juice (cranberry or apple), 4 glucose tablets, OR glucose gel. Recheck blood sugar in 15 minutes after treatment (to make sure it is greater than 70 mg/dL). If your blood sugar is not greater than 70 mg/dL on recheck, l call [facility] for further instructions. o  for further instructions. . Report your blood sugar to the short stay nurse when you get to Short Stay.  . If you are admitted to the hospital after surgery: o Your blood sugar will be checked by the staff and you will probably be given insulin after surgery (instead of oral diabetes medicines) to make sure you have good blood sugar levels. o The goal for blood sugar control after surgery is 80-180 mg/d.         WHAT DO I DO ABOUT MY DIABETES MEDICATION?   Marland Kitchen Do not take oral diabetes medicines (pills) the morning of surgery.

## 2018-09-19 NOTE — Pre-Procedure Instructions (Signed)
April Pham   Your procedure is scheduled on Monday, September 23, 2018.  Report to [facility] Entrance "A" at 8:00 AM.              Your surgery or procedure is scheduled for 10:00 AM   Call this number if you have problems the morning of surgery: [phone]  This is the number for the Pre- Surgical Desk.     Remember:  Do not eat or drink after midnight Sunday, March 15.    Take these medicines the morning of surgery with A SIP OF WATER  Venlafaxine (effoxor-XR) Cetirizine (zyrtec) Flovent inhaler Linzess Omeprazole (prilosec) Letrozole (femara)  Ondansetron (zofran)-if needed Tylenol-if needed  7 days prior to surgery STOP taking any Aspirin (unless otherwise instructed by your surgeon), Aleve, Naproxen, Ibuprofen, Motrin, Advil, Goody's, BC's, all herbal medications, fish oil, and all vitamins   - Preparing For Surgery  Before surgery, you can play an important role. Because skin is not sterile, your skin needs to be as free of germs as possible. You can reduce the number of germs on your skin by washing with CHG (chlorahexidine gluconate) Soap before surgery.  CHG is an antiseptic cleaner which kills germs and bonds with the skin to continue killing germs even after washing.    Oral Hygiene is also important to reduce your risk of infection.  Remember - BRUSH YOUR TEETH THE MORNING OF SURGERY WITH YOUR REGULAR TOOTHPASTE  Please do not use if you have an allergy to CHG or antibacterial soaps. If your skin becomes reddened/irritated stop using the CHG.  Do not shave (including legs and underarms) for at least 48 hours prior to first CHG shower. It is OK to shave your face.  Please follow these instructions carefully.   1. Shower the NIGHT BEFORE SURGERY and the MORNING OF SURGERY with CHG.   2. If you chose to wash your hair, wash your hair first as usual with your normal shampoo.  3. After you shampoo,wash your face and private area with the soap you use  at home, then rinse your hair and body thoroughly to remove the shampoo and soap.  4.  You can apply CHG directly to the skin and wash gently with a scrungie or a clean washcloth.   5. Apply the CHG Soap to your body ONLY FROM THE NECK DOWN.  Do not use on open wounds or open sores. Avoid contact with your eyes, ears, mouth and genitals (private parts).  6. Wash thoroughly, paying special attention to the area where your surgery will be performed.  7. Thoroughly rinse your body with warm water from the neck down.  8. DO NOT shower/wash with your normal soap after using and rinsing off the CHG Soap.  9. Pat yourself dry with a CLEAN TOWEL.  10. Wear CLEAN PAJAMAS to bed the night before surgery, wear comfortable clothes the morning of surgery  11. Place CLEAN SHEETS on your bed the night of your first shower and DO NOT SLEEP WITH PETS.  Day of Surgery:  Shower as written  Do not apply any deodorants/lotions.  Please wear clean clothes to the hospital/surgery center.   Remember to brush your teeth WITH YOUR REGULAR TOOTHPASTE.  Do not wear jewelry, make-up or nail polish.  Do not wear lotions, powders, or perfumes, or deodorant.  Do not shave 48 hours prior to surgery.    Do not bring valuables to the hospital.  [facility] is not responsible  for any belongings or valuables.  Contacts, dentures or bridgework may not be worn into surgery.  Leave your suitcase in the car.  After surgery it may be brought to your room.  For patients admitted to the hospital, discharge time will be determined by your treatment team.  Patients discharged the day of surgery will not be allowed to drive home.  Please read over the following fact sheets that you were given.

## 2018-09-23 ENCOUNTER — Encounter (HOSPITAL_COMMUNITY): Admission: RE | Payer: Self-pay | Source: Home / Self Care

## 2018-09-23 ENCOUNTER — Ambulatory Visit (HOSPITAL_COMMUNITY): Admission: RE | Admit: 2018-09-23 | Payer: Medicaid Other | Source: Home / Self Care | Admitting: Neurological Surgery

## 2018-09-23 SURGERY — ULNAR NERVE DECOMPRESSION/TRANSPOSITION
Anesthesia: General | Laterality: Left

## 2019-01-02 IMAGING — CR DG CHEST 2V
2 series · 2 of 2 positions shown · non-contrast
Comparison: Chest x-ray of December 23, 2015

CLINICAL DATA: Preoperative examination prior to surgery for Chiari
malformation. History of hypertension, previous CVA, asthma

EXAM:
CHEST  2 VIEW

[w chest pa]
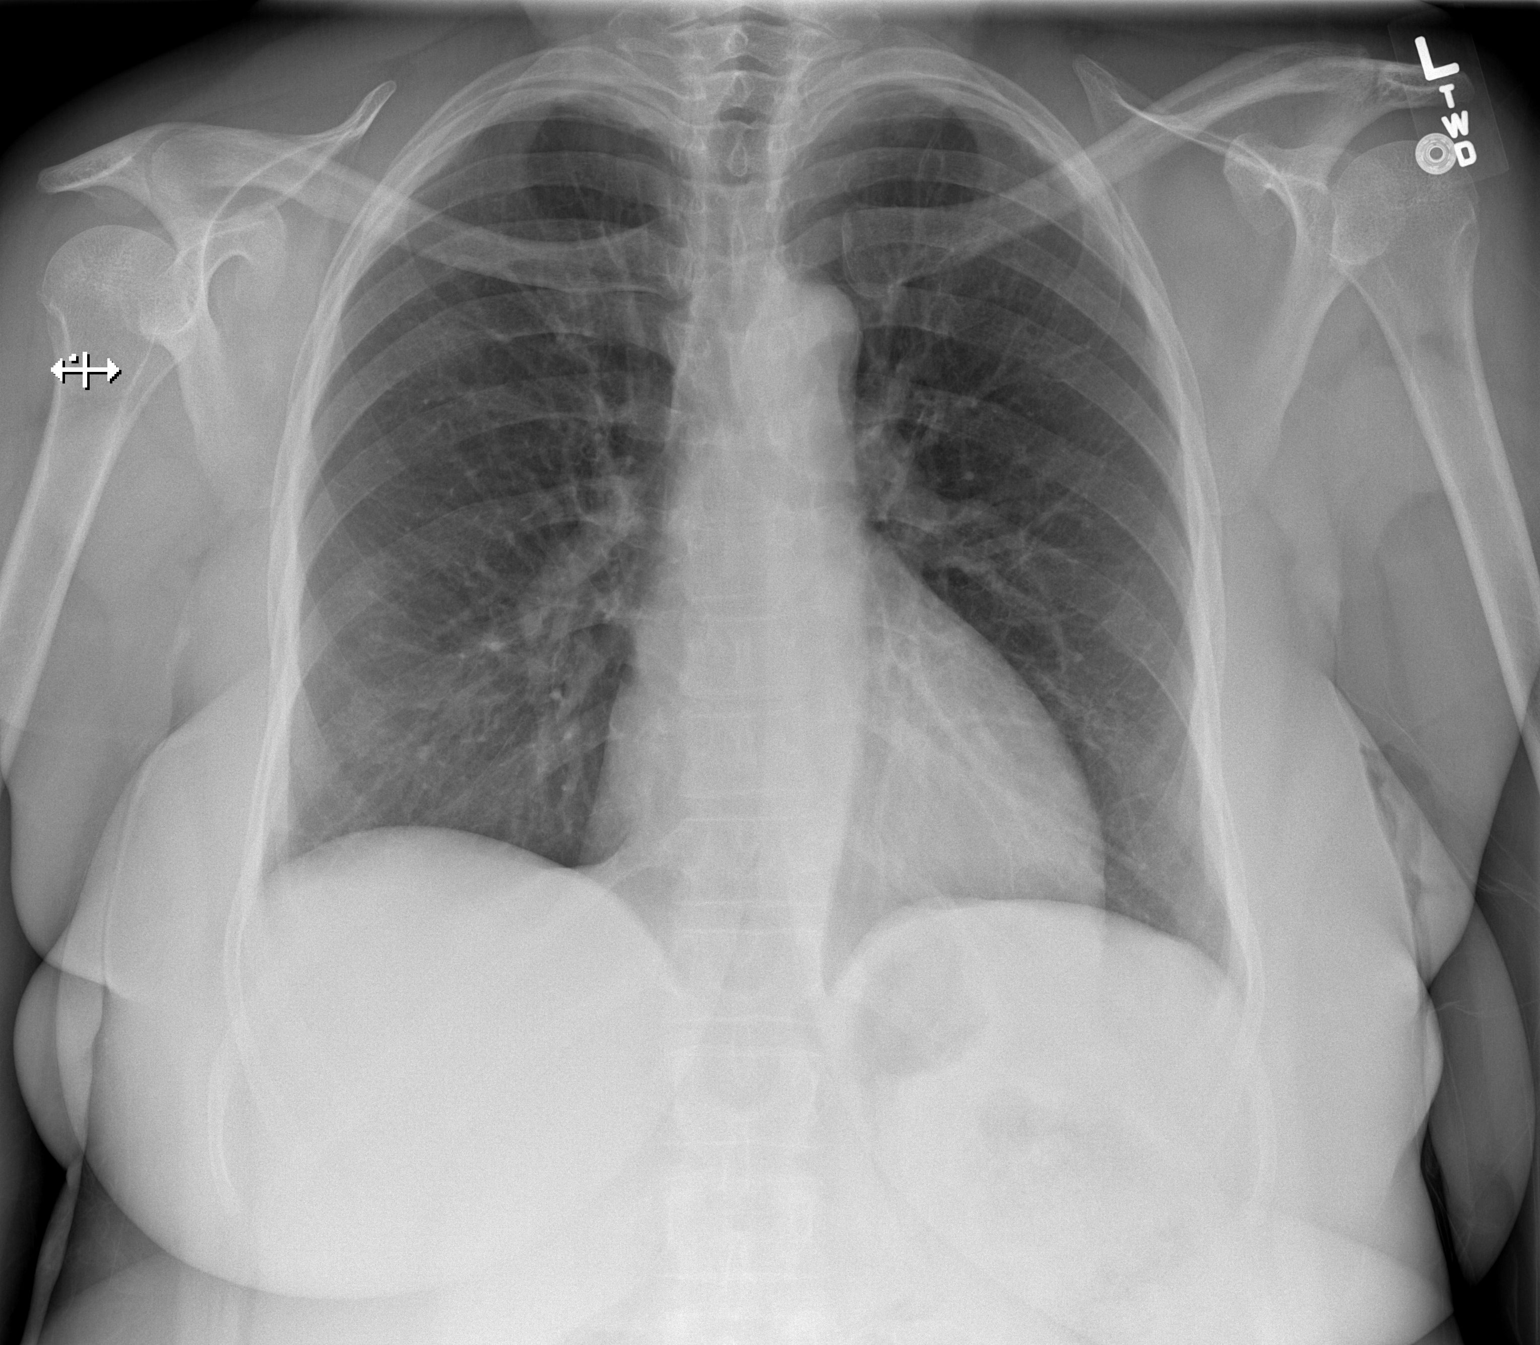

[w chest lat]
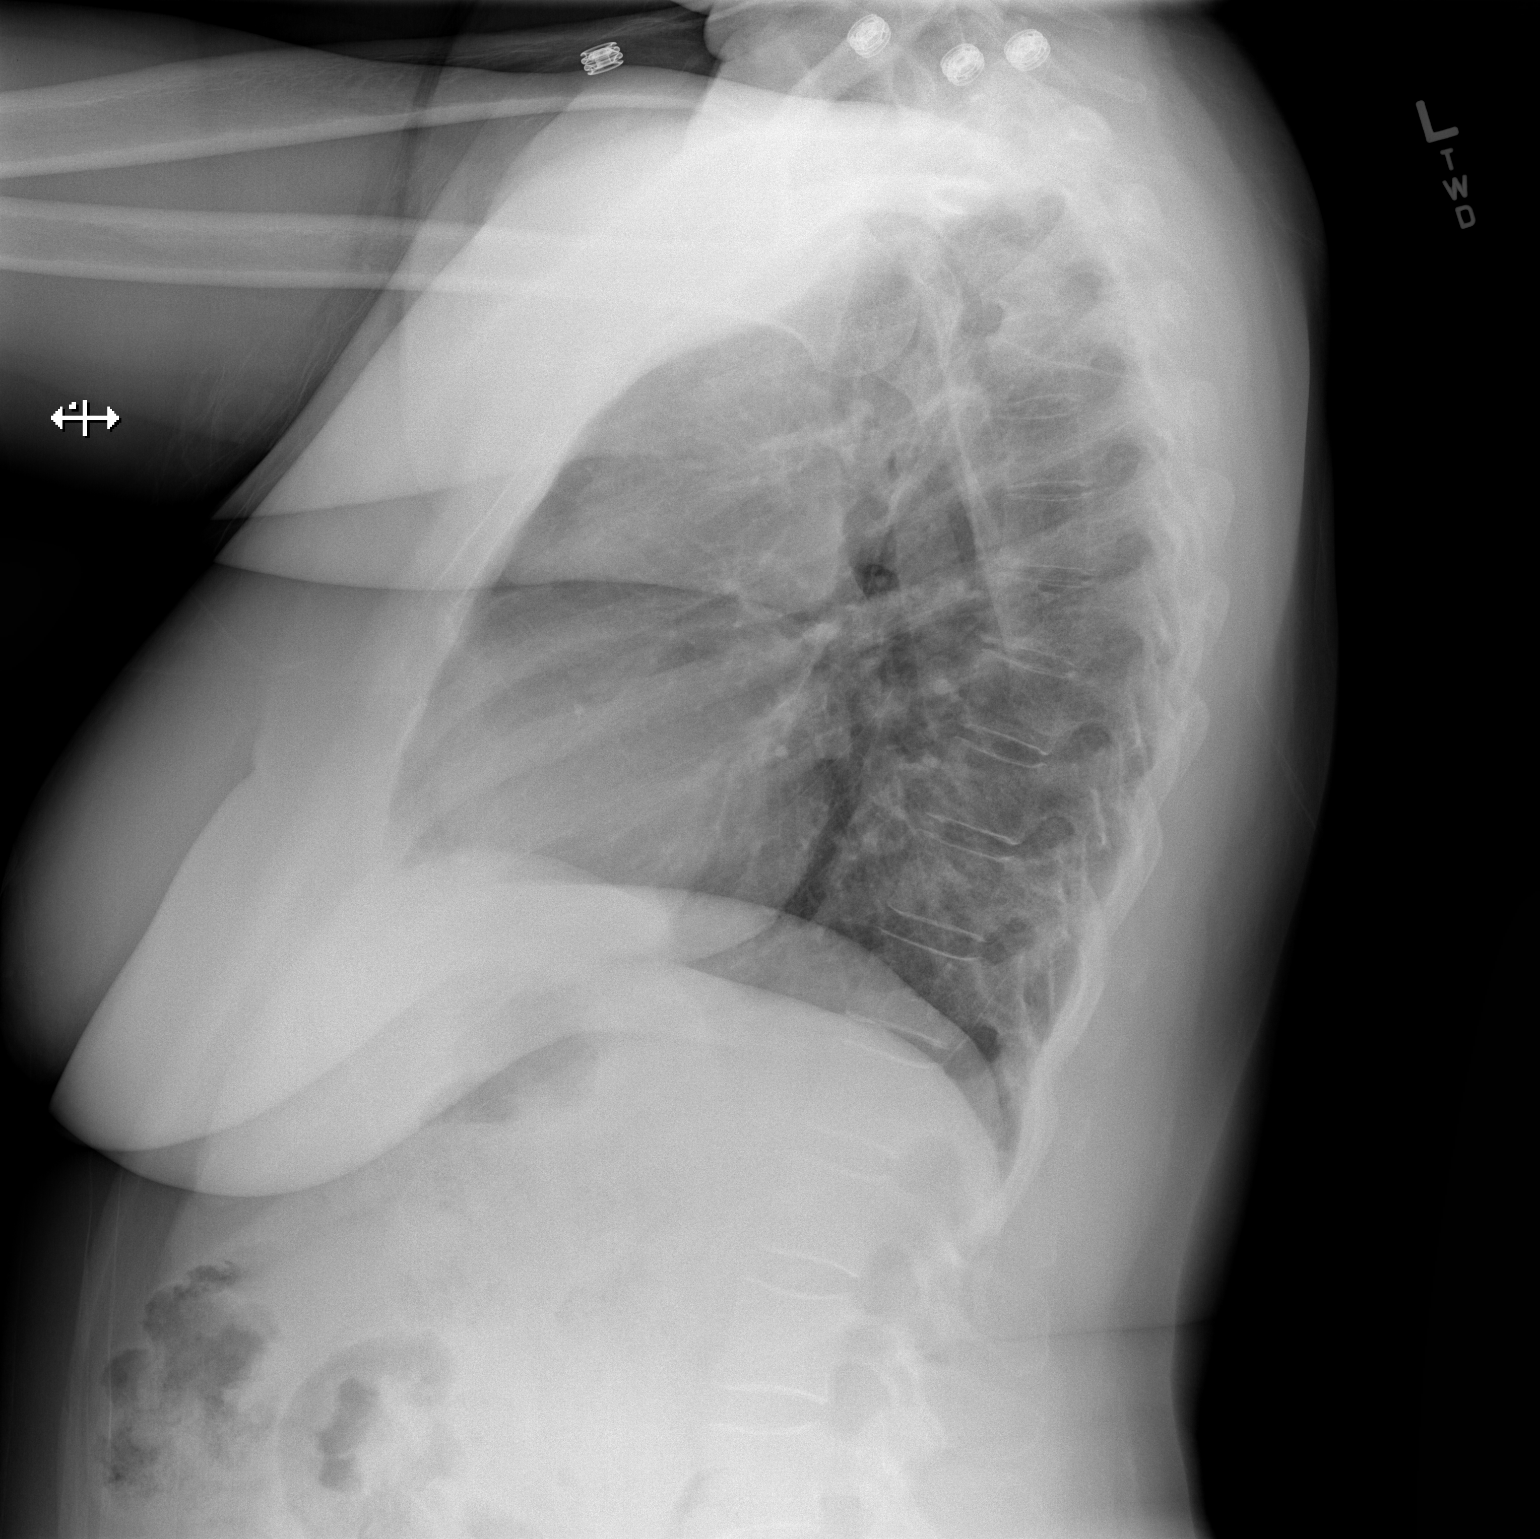

[2 of 2 positions shown; findings below may reference images not displayed]

FINDINGS: The lungs are adequately inflated and clear. There is no significant
hemidiaphragm flattening. The heart and pulmonary vascularity are
normal. The mediastinum is normal in width. The bony thorax exhibits
no acute abnormality.
IMPRESSION: There is no active cardiopulmonary disease.

## 2019-11-11 DIAGNOSIS — R748 Abnormal levels of other serum enzymes: Secondary | ICD-10-CM

## 2019-11-11 DIAGNOSIS — E041 Nontoxic single thyroid nodule: Secondary | ICD-10-CM

## 2019-11-11 DIAGNOSIS — D649 Anemia, unspecified: Secondary | ICD-10-CM

## 2020-01-19 LAB — HM COLONOSCOPY

## 2020-05-07 ENCOUNTER — Other Ambulatory Visit: Payer: Self-pay | Admitting: Hematology and Oncology

## 2020-05-07 DIAGNOSIS — D649 Anemia, unspecified: Secondary | ICD-10-CM

## 2020-05-19 ENCOUNTER — Inpatient Hospital Stay: Payer: 59 | Admitting: Oncology

## 2020-05-19 ENCOUNTER — Inpatient Hospital Stay: Payer: 59

## 2022-01-27 ENCOUNTER — Other Ambulatory Visit: Payer: Self-pay | Admitting: Oncology

## 2022-01-27 ENCOUNTER — Inpatient Hospital Stay: Payer: 59 | Attending: Oncology | Admitting: Oncology

## 2022-01-27 ENCOUNTER — Encounter: Payer: Self-pay | Admitting: Oncology

## 2022-01-27 ENCOUNTER — Inpatient Hospital Stay: Payer: 59

## 2022-01-27 VITALS — BP 135/65 | HR 69 | Temp 98.3°F | Resp 18 | Ht 66.0 in | Wt 192.1 lb

## 2022-01-27 DIAGNOSIS — Z853 Personal history of malignant neoplasm of breast: Secondary | ICD-10-CM

## 2022-01-27 LAB — CBC AND DIFFERENTIAL
HCT: 38 (ref 36–46)
Hemoglobin: 12.3 (ref 12.0–16.0)
Neutrophils Absolute: 2.81
Platelets: 228 10*3/uL (ref 150–400)
WBC: 5.3

## 2022-01-27 LAB — COMPREHENSIVE METABOLIC PANEL
Albumin: 4.2 (ref 3.5–5.0)
Calcium: 9.3 (ref 8.7–10.7)

## 2022-01-27 LAB — HEPATIC FUNCTION PANEL
ALT: 39 U/L — AB (ref 7–35)
AST: 44 — AB (ref 13–35)
Alkaline Phosphatase: 153 — AB (ref 25–125)
Bilirubin, Total: 0.4

## 2022-01-27 LAB — BASIC METABOLIC PANEL
BUN: 14 (ref 4–21)
CO2: 23 — AB (ref 13–22)
Chloride: 105 (ref 99–108)
Creatinine: 0.8 (ref 0.5–1.1)
Glucose: 101
Potassium: 4.7 mEq/L (ref 3.5–5.1)
Sodium: 138 (ref 137–147)

## 2022-01-27 LAB — CBC: RBC: 4.53 (ref 3.87–5.11)

## 2022-02-24 ENCOUNTER — Other Ambulatory Visit: Payer: Self-pay | Admitting: Oncology

## 2022-02-24 DIAGNOSIS — Z853 Personal history of malignant neoplasm of breast: Secondary | ICD-10-CM

## 2022-02-24 NOTE — Progress Notes (Signed)
[facility]  Established Patient Office Visit  Date: 01/27/22  Subjective   Patient ID: April Pham, female    DOB: Sep 08, 1955  Age: 66 y.o. 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  She was placed on hormonal therapy with tamoxifen and changed later to letrozole after she had a mild stroke.  That was stopped in December 2020, after hormonal therapy for 7 years.  She does have annual mammograms in February but I last saw the patient in May 2021.  She has never had any evidence of recurrence.  Bone density from 1 year ago revealed osteopenia of the spine with a T score of -1.8.  She was referred to me in 2021 for evaluation of anemia and elevated alkaline phosphatase.  At that time she also had significant weight loss and a left thyroid nodule.  A bone scan was negative and the anemia was just borderline normochromic, normocytic.  She does have a history of a thyroid nodule on the left side measuring 2.4 cm biopsy confirmed this to be a benign follicular nodule.  I never found an explanation for her weight loss.  Interval history She comes in for follow-up and complains of severe fatigue.  Her appetite has been on and off.  She has pain due to an infection in her large intestine with C. difficile.  She does go to the pain clinic for management of her chronic pain.  She does complain of hot flashes and does have osteoarthritis as well as osteopenia.  She had her mammogram in February and gets that annually.  She denies any abnormalities of the breast.  She does have a history of a COVID infection.  She is no longer anemic today with a hemoglobin of 12.3 and normal CBC.  Her CMP is normal other than mild elevation of her SGOT to 44 and SGPT to 39  Past medical  history  Right breast cancer, stage II History of anemia Left thyroid nodule Bilateral carpal tunnel Obesity Hyperlipidemia Esophageal stenosis Gastroesophageal reflux disease Asthma Irritable bowel syndrome Migraine headaches Ovarian cyst Traumatic brain injury Memory loss PTSD Depression and anxiety History of TIA/CVA MND deficiency  Past surgical history  Appendectomy Brain surgery for Chiari malformation decompression Right breast lumpectomy and axillary node dissection Cardiac catheterization Carpal tunnel release Cervical conization : Surgery Esophageal dilation Hernia repair Hysterectomy with bilateral salpingo-oophorectomy Myomectomy Neck surgery  Family history Patient is adopted and so unsure of biological family history  Social history She is married and has 2 children.  She works in [employer].  She does not smoke, she does not drink alcohol.  Review of Systems  Constitutional:  Positive for malaise/fatigue.  HENT: Negative.    Eyes: Negative.   Respiratory: Negative.    Cardiovascular: Negative.   Gastrointestinal:  Positive for abdominal pain and diarrhea.  Genitourinary: Negative.   Musculoskeletal:  Positive for back pain, joint pain and myalgias.  Skin: Negative.   Neurological: Negative.   Endo/Heme/Allergies: Negative.   Psychiatric/Behavioral:  Positive for depression and memory loss. The patient is nervous/anxious.      Objective:     BP 135/65 (BP Location: Left Arm, Patient Position: Sitting)   Pulse 69   Temp 98.3 F (36.8 C) (Oral)  Resp 18   Ht '5\' 6"'$  (1.676 m)   Wt 192 lb 1.6 oz (87.1 kg)   SpO2 98%   BMI 31.01 kg/m    Physical Exam Constitutional:      Appearance: Normal appearance. She is obese.  HENT:     Head: Normocephalic and atraumatic.     Nose: Nose normal.     Mouth/Throat:     Pharynx: Oropharynx is clear.  Eyes:     Extraocular Movements: Extraocular movements intact.     Conjunctiva/sclera:  Conjunctivae normal.     Pupils: Pupils are equal, round, and reactive to light.  Cardiovascular:     Rate and Rhythm: Normal rate and regular rhythm.     Heart sounds: Normal heart sounds.  Pulmonary:     Effort: Pulmonary effort is normal.     Breath sounds: Normal breath sounds.  Chest:  Breasts:    Left: Normal.     Comments: The right breast has some hyperpigmentation from previous radiation and scattered fibrocystic changes.  She has a well-healed scar in the upper outer quadrant. Abdominal:     General: Bowel sounds are normal.     Palpations: Abdomen is soft.  Musculoskeletal:        General: Normal range of motion.     Cervical back: Normal range of motion.  Skin:    General: Skin is warm and dry.  Neurological:     General: No focal deficit present.     Mental Status: She is alert and oriented to person, place, and time.  Psychiatric:        Mood and Affect: Mood normal.        Behavior: Behavior normal.    Results for orders placed or performed in visit on 01/27/22  CBC and differential  Result Value Ref Range   Hemoglobin 12.3 12.0 - 16.0   HCT 38 36 - 46   Neutrophils Absolute 2.81    Platelets 228 150 - 400 K/uL   WBC 5.3   CBC  Result Value Ref Range   RBC 4.53 3.87 - 2.35  Basic metabolic panel  Result Value Ref Range   Glucose 101    BUN 14 4 - 21   CO2 23 (A) 13 - 22   Creatinine 0.8 0.5 - 1.1   Potassium 4.7 3.5 - 5.1 mEq/L   Sodium 138 137 - 147   Chloride 105 99 - 108  Comprehensive metabolic panel  Result Value Ref Range   Calcium 9.3 8.7 - 10.7   Albumin 4.2 3.5 - 5.0  Hepatic function panel  Result Value Ref Range   Alkaline Phosphatase 153 (A) 25 - 125   ALT 39 (A) 7 - 35 U/L   AST 44 (A) 13 - 35   Bilirubin, Total 0.4       The 10-year ASCVD risk score (Arnett DK, et al., 2019) is: 26.3%    Assessment & Plan:   History of stage II breast cancer This was diagnosed in 2013 and treated with surgery, radiation, and hormonal  therapy for 7 years total.  This was partly tamoxifen and then letrozole until December 2020.  She does have annual mammograms in February  Osteopenia Her last bone density scan was in July 2022 and revealed a T score of -1.8 of the spine.  Anemia Resolved  History of CVA/TIA She was taken off tamoxifen at that time and changed to letrozole which was later stopped in 2020.  Left thyroid nodule  This was 2.4 cm in 2021 but biopsy revealed a benign follicular nodule.    Problem List Items Addressed This Visit       Unprioritized   History of breast cancer - Primary    Return in about 1 year (around 01/29/2023).    Derwood Kaplan, MD

## 2023-01-26 NOTE — Progress Notes (Deleted)
[facility]  [address] [place],  [place]  [address] [phone]  Clinic Day:  01/26/2023  Referring physician: Ottie Glazier, DO   CHIEF COMPLAINT:  CC: Remote history of stage II hormone receptor positive breast cancer  Current Treatment:  Surveillance  HISTORY OF PRESENT ILLNESS:  April Pham is a 67 y.o. 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  She was treated with lumpectomy and radiation, followed by hormonal therapy with tamoxifen, which was changed to letrozole after she had a mild stroke.  She discontinued letrozole in December 2020 after 7 years of hormonal therapy. She underwent a total hysterectomy and bilateral salpingo- oophorectomy at age 13 due to fibroids.   We began seeing her in May 2021 for evaluation of normochromic, normocytic anemia, elevated alkaline phosphatase, and history of a left thyroid nodule, as well as significant weight loss.  Nuclear bone scan did not reveal any evidence of malignancy.  Biopsy of the thyroid nodule was benign.  CT chest, abdomen and pelvis for further evaluation did not reveal any evidence of malignancy or other acute abnormality.  A nonobstructive 10 mm left renal calculus was seen.  The 2.9 cm left thyroid nodule was re-demonstrated. This was 2.4 cm in 2021 but biopsy revealed a benign follicular nodule.  We never found an explanation for her weight loss.  Due to her personal history of malignancy and no information on her biologic family history, she underwent genetic testing with the [employer] Common Hereditary Cancers Panel test in July 2021. This did not reveal any clinically significantly mutations. There were variants of uncertain significance of CHEK2 and RAD51C.  Mammogram in February 2023 did not reveal any evidence of malignancy.Bone density scan in July 2023 revealed osteopenia with a T-score of -1.8 in the  spine.  INTERVAL HISTORY:  April Pham is here today for repeat clinical assessment. She denies fevers or chills. She denies pain. Her appetite is good. Her weight {Weight change:10426}.  REVIEW OF SYSTEMS:  Review of Systems - Oncology   VITALS:  There were no vitals taken for this visit.  Wt Readings from Last 3 Encounters:  01/27/22 192 lb 1.6 oz (87.1 kg)  09/19/18 209 lb 11.2 oz (95.1 kg)  07/14/16 208 lb 9.6 oz (94.6 kg)    There is no height or weight on file to calculate BMI.  Performance status (ECOG): {CHL ONC [id number]  PHYSICAL EXAM:  Physical Exam  LABS:      Latest Ref Rng & Units 01/27/2022   12:00 AM 09/19/2018    1:26 PM 07/13/2016    8:34 AM  CBC  WBC  5.3     5.6  3.7   Hemoglobin 12.0 - 16.0 12.3     12.2  9.5   Hematocrit 36 - 46 38     40.2  29.6   Platelets 150 - 400 K/uL 228     261  PLATELET CLUMPS NOTED ON SMEAR, COUNT APPEARS DECREASED      This result is from an external source.      Latest Ref Rng & Units 01/27/2022   12:00 AM 09/19/2018    1:26 PM 07/13/2016    8:34 AM  CMP  Glucose 70 - 99 mg/dL  92  86   BUN 4 - 21 14     9  14    Creatinine 0.5 - 1.1 0.8  1.02  1.06   Sodium 137 - 147 138     140  137   Potassium 3.5 - 5.1 mEq/L 4.7     3.9  4.0   Chloride 99 - 108 105     110  105   CO2 13 - 22 23     22  21    Calcium 8.7 - 10.7 9.3     9.8  9.8   Alkaline Phos 25 - 125 153        AST 13 - 35 44        ALT 7 - 35 U/L 39           This result is from an external source.     No results found for: "CEA1", "CEA" / No results found for: "CEA1", "CEA" No results found for: "PSA1" No results found for: "WUJ811" No results found for: "CAN125"  No results found for: "TOTALPROTELP", "ALBUMINELP", "A1GS", "A2GS", "BETS", "BETA2SER", "GAMS", "MSPIKE", "SPEI" No results found for: "TIBC", "FERRITIN", "IRONPCTSAT" No results found for: "LDH"  STUDIES:  No results found.    HISTORY:   Past Medical History:  Diagnosis Date    Anemia    low iron and low hemoglobin   Anxiety    Arthritis    Asthma    Bladder incontinence    Cancer (HCC)    Breast cancer - right   Carpal tunnel syndrome    left (right wrist has been corrected)   Chiari malformation type I (HCC)    Chronic kidney disease    per MD notes in EPIC   Constipation    Depression    GERD (gastroesophageal reflux disease)    Headache    Heart murmur    as a child   History of hiatal hernia    History of kidney stones    Hypertension    Irritable bowel syndrome    Pneumonia    years ago   Pre-diabetes    Stroke [facility])    TIA's (-2013), Stroke (right side weakness - uses walker)    Past Surgical History:  Procedure Laterality Date   ABDOMINAL HYSTERECTOMY     APPENDECTOMY     BREAST SURGERY Right    lumpectomy and lymph node removed   CARPAL TUNNEL RELEASE Right    CARPAL TUNNEL RELEASE Left    SUBOCCIPITAL CRANIECTOMY CERVICAL LAMINECTOMY N/A 07/13/2016   Procedure: Suboccipital Decompression for Chiari Malformation;  Surgeon: [name], MD;  Location: [facility];  Service: Neurosurgery;  Laterality: N/A;    Family History  Adopted: Yes  Problem Relation Age of Onset   Hypertension Daughter     Social History:  reports that she has never smoked. She has never used smokeless tobacco. She reports that she does not drink alcohol and does not use drugs.The patient is {Blank single:19197::"alone","accompanied by"} *** today.  Allergies:  Allergies  Allergen Reactions   Ibuprofen Other (See Comments)    REFLUX Other reaction(s): GI intolerance Other reaction(s): Other (See Comments) REFLUX Upsets reflux   Nsaids Other (See Comments)    REFLUX [IBUPROFEN]   Tolmetin     Other reaction(s): Other (See Comments) REFLUX [IBUPROFEN]   Codeine    Cyclobenzaprine     Drug interactions with psychotropic meds   Prednisone     Per pt was told interacts with her other meds    Current Medications: Current Outpatient Medications   Medication Sig Dispense Refill   acetaminophen-codeine (TYLENOL #3) 300-30 MG  tablet Take by mouth.     albuterol (PROAIR HFA) 108 (90 Base) MCG/ACT inhaler inhale TWO PUFFS BY MOUTH EVERY 6 HOURS AS NEEDED FOR WHEEZING OR SHORTNESS OF BREATH     albuterol (VENTOLIN HFA) 108 (90 Base) MCG/ACT inhaler SMARTSIG:2 Puff(s) By Mouth Every 6 Hours PRN     atenolol (TENORMIN) 50 MG tablet Take 50 mg by mouth every morning.     atorvastatin (LIPITOR) 20 MG tablet Take 20 mg by mouth at bedtime.     fluticasone (FLONASE) 50 MCG/ACT nasal spray SMARTSIG:2 Spray(s) Both Nares Every Morning     levothyroxine (SYNTHROID) 25 MCG tablet Take 25 mcg by mouth every morning.     omeprazole (PRILOSEC) 40 MG capsule Take 1 capsule by mouth 2 (two) times daily.     potassium chloride SA (KLOR-CON M) 20 MEQ tablet Take 1 tablet by mouth daily.     prazosin (MINIPRESS) 1 MG capsule SMARTSIG:1 Capsule(s) By Mouth Every Evening     QUEtiapine (SEROQUEL) 25 MG tablet SMARTSIG:1 Tablet(s) By Mouth Every Evening     vancomycin (VANCOCIN) 125 MG capsule Take 125 mg by mouth 4 (four) times daily.     venlafaxine XR (EFFEXOR-XR) 150 MG 24 hr capsule Take by mouth. Takes with 75mg      venlafaxine XR (EFFEXOR-XR) 75 MG 24 hr capsule Take 75 mg by mouth daily with breakfast. Takes with 150mg      Vitamin D, Ergocalciferol, (DRISDOL) 1.25 MG (50000 UNIT) CAPS capsule Take 50,000 Units by mouth once a week.     No current facility-administered medications for this visit.     ASSESSMENT & PLAN:   Assessment & Plan: Robby Pirani is a 67 y.o. female with ***.  The patient understands the plans discussed today and is in agreement with them.  She knows to contact our office if she develops concerns prior to her next appointment.     I provided *** minutes of face-to-face time during this encounter and > 50% was spent counseling as documented under my assessment and plan.    Adah Perl, PA-C  [facility] [address] [place] [address] Dept: [phone] Dept Fax: [phone]   No orders of the defined types were placed in this encounter.

## 2023-01-29 ENCOUNTER — Inpatient Hospital Stay: Payer: 59 | Admitting: Hematology and Oncology

## 2023-01-29 ENCOUNTER — Inpatient Hospital Stay: Payer: 59

## 2023-01-29 ENCOUNTER — Telehealth: Payer: Self-pay | Admitting: Hematology and Oncology

## 2023-01-29 NOTE — Telephone Encounter (Signed)
01/29/2023  Patient was confused about her Appt's for today - Transferred to Scheduling to reschedule

## 2023-01-31 NOTE — Progress Notes (Signed)
[facility]  [address] [place],  [place]  [address] [phone]  Clinic Day:  02/07/2023  Referring physician: Ottie Glazier, DO   CHIEF COMPLAINT:  CC: Anemia, history of breast cancer  Current Treatment:  Surveillance  HISTORY OF PRESENT ILLNESS:  April Pham is a 67 y.o. 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  She was treated with lumpectomy and radiation, followed by hormonal therapy with tamoxifen, which was changed to letrozole after she had a mild stroke.  She discontinued letrozole in December 2020 after 7 years of hormonal therapy. She underwent a total hysterectomy and bilateral salpingo- oophorectomy at age 64 due to fibroids.   We began seeing her in May 2021 for evaluation of normochromic, normocytic anemia, elevated alkaline phosphatase, and history of a left thyroid nodule, as well as significant weight loss.  Her hemoglobin and alkaline phosphatase were normal at the time of consultation.  Nuclear bone scan did not reveal any evidence of malignancy.  Biopsy of the thyroid nodule was benign.  CT chest, abdomen and pelvis for further evaluation did not reveal any evidence of malignancy or other acute abnormality.  A nonobstructive 10 mm left renal calculus was seen.  The 2.9 cm left thyroid nodule was re-demonstrated. This was 2.4 cm in April 2021. Cytology revealed a benign follicular nodule.  We never found an explanation for her weight loss, which later stabilized.  Due to her personal history of malignancy and no information on her biologic family history, she underwent genetic testing with the [employer] Common Hereditary Cancers Panel test in July 2021. This did not reveal any clinically significantly mutations. There were variants of uncertain significance of CHEK2 and RAD51C.    Mammogram in February 2023 did not reveal any evidence of malignancy.   Bone density scan in July 2023 revealed osteopenia with a T-score of -1.8 in the spine, for which she was taking vitamin D.  At her visit in July 2023, she had mild elevation of the transaminases, but alkaline phosphatase remained normal.  INTERVAL HISTORY:  [name] is here today for annual follow-up.  She reports persistent tenderness in the right breast and axilla.  She denies any changes in her breasts.  She reports fatigue.  She reports occasional shortness of breath, which is attributed to asthma.  She denies fevers or chills. She reports bilateral knee pain, which is attributed to arthritis. Her appetite is good. Her weight has increased 5 pounds over last year .  She states she was in the hospital in the last 6 months for severe migraine headache and was given sumatriptan nasal spray, which is effective.  She saw a neurologist and is on Depakote for provide prevention of migraines.  Bilateral screening mammogram at [facility] in February did not reveal any evidence of malignancy.  She states she has had a colonoscopy, but is not sure when she is due again.  I cannot find a colonoscopy report in her medical record.  REVIEW OF SYSTEMS:  Review of Systems  Constitutional:  Positive for fatigue. Negative for appetite change, chills, fever and unexpected weight change.  HENT:   Negative for lump/mass, mouth sores and sore throat.   Respiratory:  Positive for shortness of breath. Negative for cough.   Cardiovascular:  Negative for chest pain and leg swelling.  Gastrointestinal:  Negative for abdominal pain, constipation, diarrhea, nausea and vomiting.  Endocrine: Negative for  hot flashes.  Genitourinary:  Negative for difficulty urinating, dysuria, frequency and hematuria.   Musculoskeletal:  Positive for arthralgias. Negative for back pain and myalgias.  Skin:  Negative for rash.  Neurological:  Negative for dizziness and headaches.  Hematological:  Negative for adenopathy. Does not bruise/bleed  easily.  Psychiatric/Behavioral:  Negative for depression and sleep disturbance. The patient is not nervous/anxious.      VITALS:  Blood pressure (!) 158/72, pulse 63, temperature 97.8 F (36.6 C), temperature source Oral, resp. rate 18, height 5\' 6"  (1.676 m), weight 197 lb 4.8 oz (89.5 kg), SpO2 95%.  Wt Readings from Last 3 Encounters:  02/07/23 197 lb 4.8 oz (89.5 kg)  01/27/22 192 lb 1.6 oz (87.1 kg)  09/19/18 209 lb 11.2 oz (95.1 kg)    Body mass index is 31.85 kg/m.  Performance status (ECOG): 1 - Symptomatic but completely ambulatory  PHYSICAL EXAM:  Physical Exam Vitals and nursing note reviewed.  Constitutional:      General: She is not in acute distress.    Appearance: Normal appearance.  HENT:     Head: Normocephalic and atraumatic.     Mouth/Throat:     Mouth: Mucous membranes are moist.     Pharynx: Oropharynx is clear. No oropharyngeal exudate or posterior oropharyngeal erythema.  Eyes:     General: No scleral icterus.    Extraocular Movements: Extraocular movements intact.     Conjunctiva/sclera: Conjunctivae normal.     Pupils: Pupils are equal, round, and reactive to light.  Cardiovascular:     Rate and Rhythm: Normal rate and regular rhythm.     Heart sounds: Normal heart sounds. No murmur heard.    No friction rub. No gallop.  Pulmonary:     Effort: Pulmonary effort is normal.     Breath sounds: Normal breath sounds. No wheezing, rhonchi or rales.  Chest:  Breasts:    Right: Tenderness present. No inverted nipple, mass, nipple discharge or skin change.     Left: Normal. No inverted nipple, mass, nipple discharge, skin change or tenderness.  Abdominal:     General: There is no distension.     Palpations: Abdomen is soft. There is no mass.     Tenderness: There is no abdominal tenderness.  Musculoskeletal:        General: Normal range of motion.     Cervical back: Normal range of motion and neck supple. No tenderness.     Right lower leg: No edema.      Left lower leg: No edema.  Lymphadenopathy:     Cervical: No cervical adenopathy.     Upper Body:     Right upper body: No supraclavicular or axillary adenopathy.     Left upper body: No supraclavicular or axillary adenopathy.     Lower Body: No right inguinal adenopathy. No left inguinal adenopathy.  Skin:    General: Skin is warm and dry.     Coloration: Skin is not jaundiced.     Findings: No rash.  Neurological:     Mental Status: She is alert and oriented to person, place, and time.     Cranial Nerves: No cranial nerve deficit.  Psychiatric:        Mood and Affect: Mood normal.        Behavior: Behavior normal.        Thought Content: Thought content normal.     LABS:      Latest Ref Rng & Units 02/07/2023  9:30 AM 01/27/2022   12:00 AM 09/19/2018    1:26 PM  CBC  WBC 4.0 - 10.5 K/uL 5.2  5.3     5.6   Hemoglobin 12.0 - 15.0 g/dL 16.1  09.6     04.5   Hematocrit 36.0 - 46.0 % 37.4  38     40.2   Platelets 150 - 400 K/uL 227  228     261      This result is from an external source.      Latest Ref Rng & Units 02/07/2023    9:30 AM 01/27/2022   12:00 AM 09/19/2018    1:26 PM  CMP  Glucose 70 - 99 mg/dL 83   92   BUN 8 - 23 mg/dL 15  14     9    Creatinine 0.44 - 1.00 mg/dL 4.09  0.8     8.11   Sodium 135 - 145 mmol/L 140  138     140   Potassium 3.5 - 5.1 mmol/L 4.1  4.7     3.9   Chloride 98 - 111 mmol/L 106  105     110   CO2 22 - 32 mmol/L 26  23     22    Calcium 8.9 - 10.3 mg/dL 9.3  9.3     9.8   Total Protein 6.5 - 8.1 g/dL 7.1     Total Bilirubin 0.3 - 1.2 mg/dL 0.3     Alkaline Phos 38 - 126 U/L 99  153       AST 15 - 41 U/L 18  44       ALT 0 - 44 U/L 15  39          This result is from an external source.     No results found for: "CEA1", "CEA" / No results found for: "CEA1", "CEA" No results found for: "PSA1" No results found for: "BJY782" No results found for: "CAN125"  No results found for: "TOTALPROTELP", "ALBUMINELP", "A1GS", "A2GS",  "BETS", "BETA2SER", "GAMS", "MSPIKE", "SPEI" No results found for: "TIBC", "FERRITIN", "IRONPCTSAT" No results found for: "LDH"  STUDIES:  No results found.    HISTORY:   Past Medical History:  Diagnosis Date   Anemia    low iron and low hemoglobin   Anxiety    Arthritis    Asthma    Bladder incontinence    Cancer (HCC)    Breast cancer - right   Carpal tunnel syndrome    left (right wrist has been corrected)   Chiari malformation type I (HCC)    Chronic kidney disease    per MD notes in EPIC   Constipation    Depression    GERD (gastroesophageal reflux disease)    Headache    Heart murmur    as a child   History of hiatal hernia    History of kidney stones    Hypertension    Irritable bowel syndrome    Pneumonia    years ago   Pre-diabetes    Stroke [facility])    TIA's (-2013), Stroke (right side weakness - uses walker)    Past Surgical History:  Procedure Laterality Date   ABDOMINAL HYSTERECTOMY     APPENDECTOMY     BREAST SURGERY Right    lumpectomy and lymph node removed   CARPAL TUNNEL RELEASE Right    CARPAL TUNNEL RELEASE Left    SUBOCCIPITAL CRANIECTOMY CERVICAL LAMINECTOMY N/A 07/13/2016   Procedure: Suboccipital Decompression  for Chiari Malformation;  Surgeon: [name], MD;  Location: [facility] OR;  Service: Neurosurgery;  Laterality: N/A;    Family History  Adopted: Yes  Problem Relation Age of Onset   Hypertension Daughter     Social History:  reports that she has never smoked. She has never used smokeless tobacco. She reports that she does not drink alcohol and does not use drugs.The patient is alone today.  Allergies:  Allergies  Allergen Reactions   Ibuprofen Other (See Comments)    REFLUX Other reaction(s): GI intolerance Other reaction(s): Other (See Comments) REFLUX Upsets reflux   Nsaids Other (See Comments)    REFLUX [IBUPROFEN]   Tolmetin     Other reaction(s): Other (See Comments) REFLUX [IBUPROFEN]   Codeine    Cyclobenzaprine      Drug interactions with psychotropic meds   Prednisone     Per pt was told interacts with her other meds    Current Medications: Current Outpatient Medications  Medication Sig Dispense Refill   acetaminophen (TYLENOL) 500 MG tablet Take by mouth.     SUMAtriptan (IMITREX) 20 MG/ACT nasal spray Place 20 mg into the nose every 2 (two) hours as needed for headache or migraine. May repeat in 2 hours if headache persists or recurs.     albuterol (PROAIR HFA) 108 (90 Base) MCG/ACT inhaler inhale TWO PUFFS BY MOUTH EVERY 6 HOURS AS NEEDED FOR WHEEZING OR SHORTNESS OF BREATH     amLODipine (NORVASC) 5 MG tablet Take 5 mg by mouth every morning.     atenolol (TENORMIN) 50 MG tablet Take 50 mg by mouth every morning.     atorvastatin (LIPITOR) 20 MG tablet Take 20 mg by mouth at bedtime.     divalproex (DEPAKOTE ER) 500 MG 24 hr tablet Take 500 mg by mouth every morning.     fluticasone (FLONASE) 50 MCG/ACT nasal spray SMARTSIG:2 Spray(s) Both Nares Every Morning     levothyroxine (SYNTHROID) 25 MCG tablet Take 25 mcg by mouth every morning.     potassium chloride SA (KLOR-CON M) 20 MEQ tablet Take 1 tablet by mouth daily.     prazosin (MINIPRESS) 1 MG capsule SMARTSIG:1 Capsule(s) By Mouth Every Evening     QUEtiapine (SEROQUEL) 25 MG tablet SMARTSIG:1 Tablet(s) By Mouth Every Evening     SYMBICORT 160-4.5 MCG/ACT inhaler Inhale 2 puffs into the lungs 2 (two) times daily.     venlafaxine XR (EFFEXOR-XR) 75 MG 24 hr capsule Take 75 mg by mouth daily with breakfast. Takes with 150mg      Vitamin D, Ergocalciferol, (DRISDOL) 1.25 MG (50000 UNIT) CAPS capsule Take 50,000 Units by mouth once a week.     No current facility-administered medications for this visit.     ASSESSMENT & PLAN:   Assessment & Plan: Sybel Kostiuk is a 67 y.o. female with a history of anemia.  Hemoglobin has dropped slightly since last check.  I will add nutritional studies to evaluate further.  Her transaminases  were previously elevated, but have normalized.  She also has a remote history of changed II hormone receptor positive breast cancer.  She remains without evidence of recurrence the patient understands the plans discussed today and is in agreement with them.  I will plan to see her back in 3 months with a CBC and comprehensive metabolic panel.  She knows to contact our office if she develops concerns prior to her next appointment.     I provided 30 minutes of face-to-face time  during this encounter and > 50% was spent counseling as documented under my assessment and plan.    Adah Perl, PA-C  [facility] AT [facility] [address] [place] [address] Dept: [phone] Dept Fax: [phone]   No orders of the defined types were placed in this encounter.

## 2023-02-07 ENCOUNTER — Inpatient Hospital Stay (HOSPITAL_BASED_OUTPATIENT_CLINIC_OR_DEPARTMENT_OTHER): Payer: 59 | Admitting: Hematology and Oncology

## 2023-02-07 ENCOUNTER — Inpatient Hospital Stay: Payer: 59 | Attending: Hematology and Oncology

## 2023-02-07 ENCOUNTER — Encounter: Payer: Self-pay | Admitting: Hematology and Oncology

## 2023-02-07 VITALS — BP 158/72 | HR 63 | Temp 97.8°F | Resp 18 | Ht 66.0 in | Wt 197.3 lb

## 2023-02-07 DIAGNOSIS — Z923 Personal history of irradiation: Secondary | ICD-10-CM | POA: Diagnosis not present

## 2023-02-07 DIAGNOSIS — Z90710 Acquired absence of both cervix and uterus: Secondary | ICD-10-CM | POA: Diagnosis not present

## 2023-02-07 DIAGNOSIS — D649 Anemia, unspecified: Secondary | ICD-10-CM | POA: Diagnosis not present

## 2023-02-07 DIAGNOSIS — Z90722 Acquired absence of ovaries, bilateral: Secondary | ICD-10-CM | POA: Diagnosis not present

## 2023-02-07 DIAGNOSIS — Z853 Personal history of malignant neoplasm of breast: Secondary | ICD-10-CM | POA: Diagnosis not present

## 2023-02-07 DIAGNOSIS — M8588 Other specified disorders of bone density and structure, other site: Secondary | ICD-10-CM | POA: Diagnosis not present

## 2023-02-07 LAB — CBC WITH DIFFERENTIAL (CANCER CENTER ONLY)
Abs Immature Granulocytes: 0.01 10*3/uL (ref 0.00–0.07)
Basophils Absolute: 0 10*3/uL (ref 0.0–0.1)
Basophils Relative: 0 %
Eosinophils Absolute: 0.1 10*3/uL (ref 0.0–0.5)
Eosinophils Relative: 2 %
HCT: 37.4 % (ref 36.0–46.0)
Hemoglobin: 11.5 g/dL — ABNORMAL LOW (ref 12.0–15.0)
Immature Granulocytes: 0 %
Lymphocytes Relative: 41 %
Lymphs Abs: 2.1 10*3/uL (ref 0.7–4.0)
MCH: 27.4 pg (ref 26.0–34.0)
MCHC: 30.7 g/dL (ref 30.0–36.0)
MCV: 89.3 fL (ref 80.0–100.0)
Monocytes Absolute: 0.6 10*3/uL (ref 0.1–1.0)
Monocytes Relative: 12 %
Neutro Abs: 2.3 10*3/uL (ref 1.7–7.7)
Neutrophils Relative %: 45 %
Platelet Count: 227 10*3/uL (ref 150–400)
RBC: 4.19 MIL/uL (ref 3.87–5.11)
RDW: 13.1 % (ref 11.5–15.5)
WBC Count: 5.2 10*3/uL (ref 4.0–10.5)
nRBC: 0 % (ref 0.0–0.2)

## 2023-02-07 LAB — CMP (CANCER CENTER ONLY)
ALT: 15 U/L (ref 0–44)
AST: 18 U/L (ref 15–41)
Albumin: 3.8 g/dL (ref 3.5–5.0)
Alkaline Phosphatase: 99 U/L (ref 38–126)
Anion gap: 8 (ref 5–15)
BUN: 15 mg/dL (ref 8–23)
CO2: 26 mmol/L (ref 22–32)
Calcium: 9.3 mg/dL (ref 8.9–10.3)
Chloride: 106 mmol/L (ref 98–111)
Creatinine: 1.01 mg/dL — ABNORMAL HIGH (ref 0.44–1.00)
GFR, Estimated: >60 mL/min (ref >60)
Glucose, Bld: 83 mg/dL (ref 70–99)
Potassium: 4.1 mmol/L (ref 3.5–5.1)
Sodium: 140 mmol/L (ref 135–145)
Total Bilirubin: 0.3 mg/dL (ref 0.3–1.2)
Total Protein: 7.1 g/dL (ref 6.5–8.1)

## 2023-02-08 LAB — FOLATE: Folate: 15.9 ng/mL (ref >5.9)

## 2023-02-08 LAB — IRON AND TIBC
Iron: 52 ug/dL (ref 28–170)
Saturation Ratios: 15 % (ref 10.4–31.8)
TIBC: 346 ug/dL (ref 250–450)
UIBC: 294 ug/dL

## 2023-02-08 LAB — VITAMIN B12: Vitamin B-12: 643 pg/mL (ref 180–914)

## 2023-02-08 LAB — FERRITIN: Ferritin: 48 ng/mL (ref 11–307)

## 2023-02-12 ENCOUNTER — Telehealth: Payer: Self-pay | Admitting: Hematology and Oncology

## 2023-02-12 ENCOUNTER — Telehealth: Payer: Self-pay

## 2023-02-12 NOTE — Telephone Encounter (Signed)
Patient notified and voiced understanding.

## 2023-02-12 NOTE — Telephone Encounter (Signed)
Contacted pt to schedule an appt. Unable to reach via phone, voicemail was left.   Scheduling Message Entered by Belva Crome A on 02/09/2023 at  5:01 PM Priority: Routine <No visit type provided>  Department: [facility]  Provider:  Scheduling Notes:  Labs and f/u with [employer] in 3 months

## 2023-02-12 NOTE — Telephone Encounter (Signed)
-----   Message from Adah Perl sent at 02/09/2023  5:02 PM EDT ----- Please let her know she was a Jergens anemic again. Her vitamin levels were normal. I would like to check her again in 3 months. Have her call if she develops symptoms of worsening anemia. Thanks

## 2023-05-11 ENCOUNTER — Other Ambulatory Visit: Payer: Self-pay | Admitting: Hematology and Oncology

## 2023-05-11 DIAGNOSIS — D649 Anemia, unspecified: Secondary | ICD-10-CM

## 2023-05-11 DIAGNOSIS — Z853 Personal history of malignant neoplasm of breast: Secondary | ICD-10-CM

## 2023-05-11 NOTE — Progress Notes (Signed)
[facility]  [address] [place],  [place]  [address] [phone]  Clinic Day:  05/15/2023  Referring physician: Ottie Glazier, DO   CHIEF COMPLAINT:  CC: Anemia, history of breast cancer  Current Treatment:  Surveillance  HISTORY OF PRESENT ILLNESS:  April Pham is a 67 y.o. 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  She was treated with lumpectomy and radiation, followed by hormonal therapy with tamoxifen, which was changed to letrozole after she had a mild stroke.  She discontinued letrozole in December 2020 after 7 years of hormonal therapy. She underwent a total hysterectomy and bilateral salpingo- oophorectomy at age 50 due to fibroids.   We began seeing her in May 2021 for evaluation of normochromic, normocytic anemia, elevated alkaline phosphatase, and history of a left thyroid nodule, as well as significant weight loss.  Her hemoglobin and alkaline phosphatase were normal at the time of consultation.  Nuclear bone scan did not reveal any evidence of malignancy.  Biopsy of the thyroid nodule was benign.  CT chest, abdomen and pelvis for further evaluation did not reveal any evidence of malignancy or other acute abnormality.  A nonobstructive 10 mm left renal calculus was seen.  The 2.9 cm left thyroid nodule was re-demonstrated. This was 2.4 cm in April 2021. Cytology revealed a benign follicular nodule.  We never found an explanation for her weight loss, which later stabilized.  Due to her personal history of malignancy and no information on her biologic family history, she underwent genetic testing with the [employer] Common Hereditary Cancers Panel test in July 2021. This did not reveal any clinically significantly mutations. There were variants of uncertain significance of CHEK2 and RAD51C.    Mammogram in February 2023 did not reveal any evidence of malignancy.   Bone density scan in July 2023 revealed osteopenia with a T-score of -1.8 in the spine, for which she was taking vitamin D.  At her visit in July 2023, she had mild elevation of the transaminases, but alkaline phosphatase remained normal.  [facility]24 did not reveal any evidence of malignancy.  At her visit in July 2024, her hemoglobin had dropped to 11.5.  Iron studies, B12 and folate were normal.  Her transaminases had also normalized.   MRI/MRA brain in August did not revealed any abnormality.    INTERVAL HISTORY:  April Pham is here today for repeat clinical assessment.  She reports persistent tenderness in the right breast and axilla.  She otherwise denies any changes in her breasts.  She denies progressive fatigue concerning for recurrent anemia.  She reports occasional lightheadedness and blurry vision.  Her headaches have improved with Depakote. She reports hot flashes. She denies any overt bleeding.  She denies fevers or chills. She reports multiple arthralgias and low back pain, which are stable and attributed to arthritis. Her appetite is good. Her weight has increased 5 pounds over last year . I will a copy of her colonoscopy from Dr. Leo Rod office. She is on leave from work right now to care for her husband who has dementia.  REVIEW OF SYSTEMS:  Review of Systems  Constitutional:  Positive for fatigue. Negative for appetite change, chills, fever and unexpected weight change.  HENT:   Negative for lump/mass, mouth sores, nosebleeds and sore throat.   Respiratory:  Negative for cough, hemoptysis and shortness of breath.  Cardiovascular:  Negative for chest pain, leg swelling and palpitations.  Gastrointestinal:  Positive for abdominal pain and nausea. Negative for constipation, diarrhea and vomiting.  Endocrine: Positive for hot flashes.  Genitourinary:  Negative for difficulty urinating, dysuria, frequency, hematuria and vaginal bleeding.    Musculoskeletal:  Positive for arthralgias and back pain. Negative for myalgias.  Skin:  Negative for itching, rash and wound.  Neurological:  Negative for dizziness and headaches.  Hematological:  Negative for adenopathy. Does not bruise/bleed easily.  Psychiatric/Behavioral:  Positive for sleep disturbance. Negative for depression. The patient is not nervous/anxious.      VITALS:  Blood pressure (!) 155/82, pulse 80, temperature 98.6 F (37 C), temperature source Oral, resp. rate 18, height 5\' 6"  (1.676 m), weight 186 lb 11.2 oz (84.7 kg), SpO2 100%.  Wt Readings from Last 3 Encounters:  05/15/23 186 lb 11.2 oz (84.7 kg)  02/07/23 197 lb 4.8 oz (89.5 kg)  01/27/22 192 lb 1.6 oz (87.1 kg)    Body mass index is 30.13 kg/m.  Performance status (ECOG): 1 - Symptomatic but completely ambulatory  PHYSICAL EXAM:  Physical Exam Vitals and nursing note reviewed.  Constitutional:      General: She is not in acute distress.    Appearance: Normal appearance.  HENT:     Head: Normocephalic and atraumatic.     Mouth/Throat:     Mouth: Mucous membranes are moist.     Pharynx: Oropharynx is clear. No oropharyngeal exudate or posterior oropharyngeal erythema.  Eyes:     General: No scleral icterus.    Extraocular Movements: Extraocular movements intact.     Conjunctiva/sclera: Conjunctivae normal.     Pupils: Pupils are equal, round, and reactive to light.  Cardiovascular:     Rate and Rhythm: Normal rate and regular rhythm.     Heart sounds: Normal heart sounds. No murmur heard.    No friction rub. No gallop.  Pulmonary:     Effort: Pulmonary effort is normal.     Breath sounds: Normal breath sounds. No wheezing, rhonchi or rales.  Chest:  Breasts:    Right: Normal. No inverted nipple, mass, nipple discharge or skin change.     Left: Normal. No inverted nipple, mass, nipple discharge or skin change.  Abdominal:     General: There is no distension.     Palpations: Abdomen is soft.  There is no hepatomegaly, splenomegaly or mass.     Tenderness: There is no abdominal tenderness.  Musculoskeletal:        General: Normal range of motion.     Cervical back: Normal range of motion and neck supple. No tenderness.     Right lower leg: No edema.     Left lower leg: No edema.  Lymphadenopathy:     Cervical: No cervical adenopathy.     Upper Body:     Right upper body: No supraclavicular or axillary adenopathy.     Left upper body: No supraclavicular or axillary adenopathy.     Lower Body: No right inguinal adenopathy. No left inguinal adenopathy.  Skin:    General: Skin is warm and dry.     Coloration: Skin is not jaundiced.     Findings: No rash.  Neurological:     Mental Status: She is alert and oriented to person, place, and time.     Cranial Nerves: No cranial nerve deficit.  Psychiatric:        Mood and Affect: Mood normal.  Behavior: Behavior normal.        Thought Content: Thought content normal.     LABS:      Latest Ref Rng & Units 05/15/2023    9:44 AM 02/07/2023    9:30 AM 01/27/2022   12:00 AM  CBC  WBC 4.0 - 10.5 K/uL 4.8  5.2  5.3      Hemoglobin 12.0 - 15.0 g/dL 28.4  13.2  44.0      Hematocrit 36.0 - 46.0 % 38.8  37.4  38      Platelets 150 - 400 K/uL 256  227  228         This result is from an external source.      Latest Ref Rng & Units 05/15/2023    9:44 AM 02/07/2023    9:30 AM 01/27/2022   12:00 AM  CMP  Glucose 70 - 99 mg/dL 86  83    BUN 8 - 23 mg/dL 17  15  14       Creatinine 0.44 - 1.00 mg/dL 1.02  7.25  0.8      Sodium 135 - 145 mmol/L 142  140  138      Potassium 3.5 - 5.1 mmol/L 4.5  4.1  4.7      Chloride 98 - 111 mmol/L 106  106  105      CO2 22 - 32 mmol/L 25  26  23       Calcium 8.9 - 10.3 mg/dL 36.6  9.3  9.3      Total Protein 6.5 - 8.1 g/dL 7.2  7.1    Total Bilirubin <1.2 mg/dL <4.4  0.3    Alkaline Phos 38 - 126 U/L 125  99  153      AST 15 - 41 U/L 16  18  44      ALT 0 - 44 U/L 9  15  39         This  result is from an external source.     No results found for: "CEA1", "CEA" / No results found for: "CEA1", "CEA" No results found for: "PSA1" No results found for: "IHK742" No results found for: "CAN125"  No results found for: "TOTALPROTELP", "ALBUMINELP", "A1GS", "A2GS", "BETS", "BETA2SER", "GAMS", "MSPIKE", "SPEI" Lab Results  Component Value Date   TIBC 346 02/08/2023   FERRITIN 48 02/08/2023   IRONPCTSAT 15 02/08/2023   No results found for: "LDH"  STUDIES:  No results found.    HISTORY:   Past Medical History:  Diagnosis Date   Anemia    low iron and low hemoglobin   Anxiety    Arthritis    Asthma    Bladder incontinence    Cancer (HCC)    Breast cancer - right   Carpal tunnel syndrome    left (right wrist has been corrected)   Chiari malformation type I (HCC)    Chronic kidney disease    per MD notes in EPIC   Constipation    Depression    GERD (gastroesophageal reflux disease)    Headache    Heart murmur    as a child   History of hiatal hernia    History of kidney stones    Hypertension    Irritable bowel syndrome    Pneumonia    years ago   Pre-diabetes    Sleep apnea    Stroke [facility])    TIA's (-2013), Stroke (right side weakness - uses walker)  Past Surgical History:  Procedure Laterality Date   ABDOMINAL HYSTERECTOMY     APPENDECTOMY     BREAST SURGERY Right    lumpectomy and lymph node removed   CARPAL TUNNEL RELEASE Right    CARPAL TUNNEL RELEASE Left    SUBOCCIPITAL CRANIECTOMY CERVICAL LAMINECTOMY N/A 07/13/2016   Procedure: Suboccipital Decompression for Chiari Malformation;  Surgeon: [name], MD;  Location: [facility] OR;  Service: Neurosurgery;  Laterality: N/A;    Family History  Adopted: Yes  Problem Relation Age of Onset   Hypertension Daughter     Social History:  reports that she has never smoked. She has never used smokeless tobacco. She reports that she does not drink alcohol and does not use drugs.The patient is alone  today.  Allergies:  Allergies  Allergen Reactions   Ibuprofen Other (See Comments) and Nausea And Vomiting    REFLUX  Other reaction(s): GI intolerance  Other reaction(s): Other (See Comments)  Upsets reflux  Exacerbates GERD   Nsaids Other (See Comments)    REFLUX [IBUPROFEN]  "Doctor told me I couldn't take any"   Tolmetin     Other reaction(s): Other (See Comments) REFLUX [IBUPROFEN]   Codeine    Prednisone     Per pt was told interacts with her other meds   Cyclobenzaprine     Drug interactions with psychotropic meds    Current Medications: Current Outpatient Medications  Medication Sig Dispense Refill   famotidine (PEPCID) 20 MG tablet Take 20 mg by mouth 2 (two) times daily as needed.     lansoprazole (PREVACID) 30 MG capsule Take 30 mg by mouth 2 (two) times daily.     venlafaxine XR (EFFEXOR-XR) 150 MG 24 hr capsule Take 150 mg by mouth daily.     acetaminophen (TYLENOL) 500 MG tablet Take by mouth. (Patient not taking: Reported on 05/15/2023)     albuterol (PROAIR HFA) 108 (90 Base) MCG/ACT inhaler inhale TWO PUFFS BY MOUTH EVERY 6 HOURS AS NEEDED FOR WHEEZING OR SHORTNESS OF BREATH     amLODipine (NORVASC) 5 MG tablet Take 5 mg by mouth every morning.     atenolol (TENORMIN) 25 MG tablet Take 25 mg by mouth every morning. (Patient not taking: Reported on 05/15/2023)     atorvastatin (LIPITOR) 20 MG tablet Take 20 mg by mouth at bedtime.     divalproex (DEPAKOTE ER) 500 MG 24 hr tablet Take 500 mg by mouth every morning.     fluticasone (FLONASE) 50 MCG/ACT nasal spray SMARTSIG:2 Spray(s) Both Nares Every Morning     levothyroxine (SYNTHROID) 25 MCG tablet Take 25 mcg by mouth every morning.     potassium chloride SA (KLOR-CON M) 20 MEQ tablet Take 1 tablet by mouth daily.     prazosin (MINIPRESS) 1 MG capsule SMARTSIG:1 Capsule(s) By Mouth Every Evening (Patient not taking: Reported on 05/15/2023)     QUEtiapine (SEROQUEL) 25 MG tablet SMARTSIG:1 Tablet(s) By Mouth  Every Evening     SUMAtriptan (IMITREX) 20 MG/ACT nasal spray Place 20 mg into the nose every 2 (two) hours as needed for headache or migraine. May repeat in 2 hours if headache persists or recurs.     SYMBICORT 160-4.5 MCG/ACT inhaler Inhale 2 puffs into the lungs 2 (two) times daily.     venlafaxine XR (EFFEXOR-XR) 75 MG 24 hr capsule Take 75 mg by mouth daily with breakfast. Takes with 150mg      Vitamin D, Ergocalciferol, (DRISDOL) 1.25 MG (50000 UNIT) CAPS capsule Take  50,000 Units by mouth once a week.     No current facility-administered medications for this visit.     ASSESSMENT & PLAN:   Assessment & Plan: April Pham is a 67 y.o. female with a history of anemia.  Hemoglobin remains normal.  She also has a remote history of changed II hormone receptor positive breast cancer.  She remains without evidence of recurrence the patient understands the plans discussed today and is in agreement with them.  I will plan to see her back in 6 months with a CBC and comprehensive metabolic panel.  She knows to contact our office if she develops concerns prior to her next appointment.     I provided 30 minutes of face-to-face time during this encounter and > 50% was spent counseling as documented under my assessment and plan.    Adah Perl, PA-C  [facility] [address] [place] [address] Dept: [phone] Dept Fax: [phone]   No orders of the defined types were placed in this encounter.

## 2023-05-15 ENCOUNTER — Inpatient Hospital Stay: Payer: 59 | Attending: Hematology and Oncology

## 2023-05-15 ENCOUNTER — Encounter: Payer: Self-pay | Admitting: Hematology and Oncology

## 2023-05-15 ENCOUNTER — Telehealth: Payer: Self-pay | Admitting: Hematology and Oncology

## 2023-05-15 ENCOUNTER — Inpatient Hospital Stay (HOSPITAL_BASED_OUTPATIENT_CLINIC_OR_DEPARTMENT_OTHER): Payer: 59 | Admitting: Hematology and Oncology

## 2023-05-15 VITALS — BP 155/82 | HR 80 | Temp 98.6°F | Resp 18 | Ht 66.0 in | Wt 186.7 lb

## 2023-05-15 DIAGNOSIS — Z853 Personal history of malignant neoplasm of breast: Secondary | ICD-10-CM | POA: Insufficient documentation

## 2023-05-15 DIAGNOSIS — Z90710 Acquired absence of both cervix and uterus: Secondary | ICD-10-CM | POA: Diagnosis not present

## 2023-05-15 DIAGNOSIS — D649 Anemia, unspecified: Secondary | ICD-10-CM | POA: Insufficient documentation

## 2023-05-15 DIAGNOSIS — Z1231 Encounter for screening mammogram for malignant neoplasm of breast: Secondary | ICD-10-CM | POA: Diagnosis not present

## 2023-05-15 LAB — CBC WITH DIFFERENTIAL (CANCER CENTER ONLY)
Abs Immature Granulocytes: 0.01 10*3/uL (ref 0.00–0.07)
Basophils Absolute: 0 10*3/uL (ref 0.0–0.1)
Basophils Relative: 0 %
Eosinophils Absolute: 0.1 10*3/uL (ref 0.0–0.5)
Eosinophils Relative: 2 %
HCT: 38.8 % (ref 36.0–46.0)
Hemoglobin: 12.5 g/dL (ref 12.0–15.0)
Immature Granulocytes: 0 %
Lymphocytes Relative: 43 %
Lymphs Abs: 2.1 10*3/uL (ref 0.7–4.0)
MCH: 27.8 pg (ref 26.0–34.0)
MCHC: 32.2 g/dL (ref 30.0–36.0)
MCV: 86.2 fL (ref 80.0–100.0)
Monocytes Absolute: 0.6 10*3/uL (ref 0.1–1.0)
Monocytes Relative: 11 %
Neutro Abs: 2.1 10*3/uL (ref 1.7–7.7)
Neutrophils Relative %: 44 %
Platelet Count: 256 10*3/uL (ref 150–400)
RBC: 4.5 MIL/uL (ref 3.87–5.11)
RDW: 12.4 % (ref 11.5–15.5)
WBC Count: 4.8 10*3/uL (ref 4.0–10.5)
nRBC: 0 % (ref 0.0–0.2)
nRBC: 0 /100{WBCs}

## 2023-05-15 LAB — CMP (CANCER CENTER ONLY)
ALT: 9 U/L (ref 0–44)
AST: 16 U/L (ref 15–41)
Albumin: 4.2 g/dL (ref 3.5–5.0)
Alkaline Phosphatase: 125 U/L (ref 38–126)
Anion gap: 12 (ref 5–15)
BUN: 17 mg/dL (ref 8–23)
CO2: 25 mmol/L (ref 22–32)
Calcium: 10.2 mg/dL (ref 8.9–10.3)
Chloride: 106 mmol/L (ref 98–111)
Creatinine: 1.12 mg/dL — ABNORMAL HIGH (ref 0.44–1.00)
GFR, Estimated: 53 mL/min — ABNORMAL LOW (ref >60)
Glucose, Bld: 86 mg/dL (ref 70–99)
Potassium: 4.5 mmol/L (ref 3.5–5.1)
Sodium: 142 mmol/L (ref 135–145)
Total Bilirubin: <0.2 mg/dL (ref <1.2)
Total Protein: 7.2 g/dL (ref 6.5–8.1)

## 2023-05-15 NOTE — Telephone Encounter (Signed)
Patient has been scheduled for follow-up visit per 05/15/23 LOS.  Pt given an appt calendar with date and time.

## 2023-05-28 ENCOUNTER — Telehealth: Payer: Self-pay | Admitting: Hematology and Oncology

## 2023-05-28 NOTE — Telephone Encounter (Signed)
Created in Error

## 2023-05-31 ENCOUNTER — Telehealth: Payer: Self-pay | Admitting: Dietician

## 2023-05-31 NOTE — Telephone Encounter (Signed)
Patient screened on MST. First attempt to reach. Provided my cell# on voice mail to return call to set up a nutrition consult. ° °Cyndi Shaquita Fort, RDN, LDN °Registered Dietitian, [facility] °Part Time Remote (Usual office hours: Tuesday-Thursday) °Cell: [phone]   °

## 2023-11-13 ENCOUNTER — Encounter: Payer: Self-pay | Admitting: Hematology and Oncology

## 2023-11-13 ENCOUNTER — Inpatient Hospital Stay: Payer: 59

## 2023-11-13 ENCOUNTER — Encounter: Payer: Self-pay | Admitting: Dietician

## 2023-11-13 ENCOUNTER — Inpatient Hospital Stay: Payer: 59 | Attending: Hematology and Oncology | Admitting: Hematology and Oncology

## 2023-11-13 VITALS — BP 140/85 | HR 73 | Temp 98.7°F | Resp 20 | Ht 66.0 in | Wt 181.6 lb

## 2023-11-13 DIAGNOSIS — Z1231 Encounter for screening mammogram for malignant neoplasm of breast: Secondary | ICD-10-CM | POA: Diagnosis not present

## 2023-11-13 DIAGNOSIS — D649 Anemia, unspecified: Secondary | ICD-10-CM

## 2023-11-13 DIAGNOSIS — Z853 Personal history of malignant neoplasm of breast: Secondary | ICD-10-CM | POA: Diagnosis not present

## 2023-11-13 DIAGNOSIS — Z862 Personal history of diseases of the blood and blood-forming organs and certain disorders involving the immune mechanism: Secondary | ICD-10-CM | POA: Diagnosis present

## 2023-11-13 DIAGNOSIS — Z923 Personal history of irradiation: Secondary | ICD-10-CM | POA: Insufficient documentation

## 2023-11-13 LAB — CMP (CANCER CENTER ONLY)
ALT: 9 U/L (ref 0–44)
AST: 17 U/L (ref 15–41)
Albumin: 3.8 g/dL (ref 3.5–5.0)
Alkaline Phosphatase: 128 U/L — ABNORMAL HIGH (ref 38–126)
Anion gap: 10 (ref 5–15)
BUN: 15 mg/dL (ref 8–23)
CO2: 26 mmol/L (ref 22–32)
Calcium: 9.9 mg/dL (ref 8.9–10.3)
Chloride: 104 mmol/L (ref 98–111)
Creatinine: 1.15 mg/dL — ABNORMAL HIGH (ref 0.44–1.00)
GFR, Estimated: 51 mL/min — ABNORMAL LOW (ref >60)
Glucose, Bld: 90 mg/dL (ref 70–99)
Potassium: 4.1 mmol/L (ref 3.5–5.1)
Sodium: 140 mmol/L (ref 135–145)
Total Bilirubin: 0.2 mg/dL (ref 0.0–1.2)
Total Protein: 7 g/dL (ref 6.5–8.1)

## 2023-11-13 LAB — CBC WITH DIFFERENTIAL (CANCER CENTER ONLY)
Abs Immature Granulocytes: 0.01 10*3/uL (ref 0.00–0.07)
Basophils Absolute: 0 10*3/uL (ref 0.0–0.1)
Basophils Relative: 0 %
Eosinophils Absolute: 0.1 10*3/uL (ref 0.0–0.5)
Eosinophils Relative: 2 %
HCT: 38.7 % (ref 36.0–46.0)
Hemoglobin: 12 g/dL (ref 12.0–15.0)
Immature Granulocytes: 0 %
Lymphocytes Relative: 42 %
Lymphs Abs: 2.2 10*3/uL (ref 0.7–4.0)
MCH: 26.5 pg (ref 26.0–34.0)
MCHC: 31 g/dL (ref 30.0–36.0)
MCV: 85.6 fL (ref 80.0–100.0)
Monocytes Absolute: 0.5 10*3/uL (ref 0.1–1.0)
Monocytes Relative: 9 %
Neutro Abs: 2.5 10*3/uL (ref 1.7–7.7)
Neutrophils Relative %: 47 %
Platelet Count: 208 10*3/uL (ref 150–400)
RBC: 4.52 MIL/uL (ref 3.87–5.11)
RDW: 12.4 % (ref 11.5–15.5)
WBC Count: 5.3 10*3/uL (ref 4.0–10.5)
nRBC: 0 % (ref 0.0–0.2)
nRBC: 0 /100{WBCs}

## 2023-11-13 NOTE — Progress Notes (Signed)
 [facility]  [address] [place],  [place]  [address] [phone]  Clinic Day:  11/13/2023  Referring physician: Moghul, Amina, DO   CHIEF COMPLAINT:  CC: Anemia, history of breast cancer  Current Treatment:  Surveillance  HISTORY OF PRESENT ILLNESS:  April Pham is a 68 y.o. 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  She was treated with lumpectomy and radiation, followed by hormonal therapy with tamoxifen, which was changed to letrozole  after she had a mild stroke.  She discontinued letrozole  in December 2020 after 7 years of hormonal therapy. She underwent a total hysterectomy and bilateral salpingo- oophorectomy at age 56 due to fibroids.   Due to her personal history of malignancy and no information on her biologic family history, she underwent genetic testing with the [employer] Common Hereditary Cancers Panel test in July 2021. This did not reveal any clinically significantly mutations. There were variants of uncertain significance of CHEK2 and RAD51C.    We began seeing her in May 2021 for evaluation of normochromic, normocytic anemia, elevated alkaline phosphatase, and history of a left thyroid  nodule, as well as significant weight loss.  Her hemoglobin and alkaline phosphatase were normal at the time of consultation.  Nuclear bone scan did not reveal any evidence of malignancy.  Biopsy of the thyroid  nodule was benign.  CT chest, abdomen and pelvis for further evaluation did not reveal any evidence of malignancy or other acute abnormality.  A nonobstructive 10 mm left renal calculus was seen.  The 2.9 cm left thyroid  nodule was re-demonstrated. This was 2.4 cm in April 2021. Cytology revealed a benign follicular nodule.  We never found an explanation for her weight loss, which later stabilized.  Mammogram in February 2023 did not reveal any evidence of malignancy.  Bone  density scan in July 2023 revealed osteopenia with a T-score of -1.8 in the spine, for which she was taking vitamin D .  At her visit in July 2023, she had mild elevation of the transaminases, but alkaline phosphatase remained normal.  Bilateral screening mammogram at [facility] in February 2024 did not reveal any evidence of malignancy.  At her visit in July 2024, her hemoglobin had dropped to 11.5.  Iron studies, B12 and folate were normal.  Her transaminases had also normalized.   MRI/MRA brain in August 20024 did not revealed any abnormality.  Per her PCP's note, screening colonoscopy is due in 2031.   INTERVAL HISTORY:  April Pham is here today for repeat clinical assessment.  She denies any changes in her breasts.  She denies progressive fatigue concerning for recurrent anemia.  She denies any overt bleeding.  She denies fevers or chills. She denies pain.  Her appetite is good. Her weight has increased 5 pounds over last year .  Bilateral screening mammogram on February 28 at [facility] did not reveal any evidence of malignancy.    In November 2024, she was admitted to [facility] with obstructing 1.3 cm proximal left ureteral calculus causing  moderate left hydronephrosis and proximal hydroureter.  She required left ureteral stent.  She underwent lithotripsy in December.  She reports right sided sciatica for which she sees orthopedics.  Apparently, x-rays revealed degenerative disc disease in the lumbar spine.  She had an epidural injection in February.  She is doing PT and using walker for stability.  She states she continues to follow with neurology.  CT  head without contrast in January did not reveal any acute abnormality.  Stable postsurgical changes were seen.  She states she had to retire due to the surgery for her kidney stone.    REVIEW OF SYSTEMS:  Review of Systems  Constitutional:  Negative for appetite change, chills, fatigue, fever and unexpected weight change.  HENT:   Negative for  lump/mass, mouth sores and sore throat.   Respiratory:  Negative for cough and shortness of breath.   Cardiovascular:  Negative for chest pain and leg swelling.  Gastrointestinal:  Negative for abdominal pain, constipation, diarrhea, nausea and vomiting.  Endocrine: Negative for hot flashes.  Genitourinary:  Negative for difficulty urinating, dysuria, frequency, hematuria, vaginal bleeding and vaginal discharge.   Musculoskeletal:  Positive for back pain (with radiation down right leg) and gait problem (uses walker). Negative for arthralgias, myalgias and neck pain.  Skin:  Negative for rash.  Neurological:  Positive for gait problem (uses walker), headaches (occasional) and numbness (numbness feet). Negative for dizziness.  Hematological:  Negative for adenopathy. Does not bruise/bleed easily.  Psychiatric/Behavioral:  Negative for depression and sleep disturbance. The patient is not nervous/anxious.      VITALS:  Blood pressure (!) 140/85, pulse 73, temperature 98.7 F (37.1 C), temperature source Oral, resp. rate 20, height 5\' 6"  (1.676 m), weight 181 lb 9.6 oz (82.4 kg), SpO2 100%.  Wt Readings from Last 3 Encounters:  11/13/23 181 lb 9.6 oz (82.4 kg)  05/15/23 186 lb 11.2 oz (84.7 kg)  02/07/23 197 lb 4.8 oz (89.5 kg)    Body mass index is 29.31 kg/m.  Performance status (ECOG): 1 - Symptomatic but completely ambulatory  PHYSICAL EXAM:  Physical Exam Vitals and nursing note reviewed.  Constitutional:      General: She is not in acute distress.    Appearance: Normal appearance.  HENT:     Head: Normocephalic and atraumatic.     Mouth/Throat:     Mouth: Mucous membranes are moist.     Pharynx: Oropharynx is clear. No oropharyngeal exudate or posterior oropharyngeal erythema.  Eyes:     General: No scleral icterus.    Extraocular Movements: Extraocular movements intact.     Conjunctiva/sclera: Conjunctivae normal.     Pupils: Pupils are equal, round, and reactive to light.   Cardiovascular:     Rate and Rhythm: Normal rate and regular rhythm.     Heart sounds: Normal heart sounds. No murmur heard.    No friction rub. No gallop.  Pulmonary:     Effort: Pulmonary effort is normal.     Breath sounds: Normal breath sounds. No wheezing, rhonchi or rales.  Chest:  Breasts:    Right: Normal. No swelling, bleeding, inverted nipple, mass, nipple discharge, skin change or tenderness.     Left: Normal. No swelling, bleeding, inverted nipple, mass, nipple discharge, skin change or tenderness.  Abdominal:     General: There is no distension.     Palpations: Abdomen is soft. There is no hepatomegaly, splenomegaly or mass.     Tenderness: There is no abdominal tenderness.  Musculoskeletal:        General: Normal range of motion.     Cervical back: Normal range of motion and neck supple. No tenderness.     Right lower leg: No edema.     Left lower leg: No edema.  Lymphadenopathy:     Cervical: No cervical adenopathy.     Upper Body:     Right upper body: No supraclavicular  or axillary adenopathy.     Left upper body: No supraclavicular or axillary adenopathy.     Lower Body: No right inguinal adenopathy. No left inguinal adenopathy.  Skin:    General: Skin is warm and dry.     Coloration: Skin is not jaundiced.     Findings: No rash.  Neurological:     Mental Status: She is alert and oriented to person, place, and time.     Cranial Nerves: No cranial nerve deficit.  Psychiatric:        Mood and Affect: Mood normal.        Behavior: Behavior normal.        Thought Content: Thought content normal.     LABS:      Latest Ref Rng & Units 11/13/2023    8:58 AM 05/15/2023    9:44 AM 02/07/2023    9:30 AM  CBC  WBC 4.0 - 10.5 K/uL 5.3  4.8  5.2   Hemoglobin 12.0 - 15.0 g/dL 40.9  81.1  91.4   Hematocrit 36.0 - 46.0 % 38.7  38.8  37.4   Platelets 150 - 400 K/uL 208  256  227       Latest Ref Rng & Units 11/13/2023    8:58 AM 05/15/2023    9:44 AM 02/07/2023     9:30 AM  CMP  Glucose 70 - 99 mg/dL 90  86  83   BUN 8 - 23 mg/dL 15  17  15    Creatinine 0.44 - 1.00 mg/dL 7.82  9.56  2.13   Sodium 135 - 145 mmol/L 140  142  140   Potassium 3.5 - 5.1 mmol/L 4.1  4.5  4.1   Chloride 98 - 111 mmol/L 104  106  106   CO2 22 - 32 mmol/L 26  25  26    Calcium  8.9 - 10.3 mg/dL 9.9  08.6  9.3   Total Protein 6.5 - 8.1 g/dL 7.0  7.2  7.1   Total Bilirubin 0.0 - 1.2 mg/dL 0.2  <5.7  0.3   Alkaline Phos 38 - 126 U/L 128  125  99   AST 15 - 41 U/L 17  16  18    ALT 0 - 44 U/L 9  9  15      Lab Results  Component Value Date   TIBC 346 02/08/2023   FERRITIN 48 02/08/2023   IRONPCTSAT 15 02/08/2023    STUDIES:  No results found.    HISTORY:   Past Medical History:  Diagnosis Date   Anemia    low iron and low hemoglobin   Anxiety    Arthritis    Asthma    Bladder incontinence    Cancer (HCC)    Breast cancer - right   Carpal tunnel syndrome    left (right wrist has been corrected)   Chiari malformation type I (HCC)    Chronic kidney disease    per MD notes in EPIC   Constipation    Depression    GERD (gastroesophageal reflux disease)    Headache    Heart murmur    as a child   History of hiatal hernia    History of kidney stones    Hypertension    Irritable bowel syndrome    Pneumonia    years ago   Pre-diabetes    Sleep apnea    Stroke [facility])    TIA's (-2013), Stroke (right side weakness - uses walker)  Past Surgical History:  Procedure Laterality Date   ABDOMINAL HYSTERECTOMY     APPENDECTOMY     BREAST SURGERY Right    lumpectomy and lymph node removed   CARPAL TUNNEL RELEASE Right    CARPAL TUNNEL RELEASE Left    SUBOCCIPITAL CRANIECTOMY CERVICAL LAMINECTOMY N/A 07/13/2016   Procedure: Suboccipital Decompression for Chiari Malformation;  Surgeon: Isadora Mar, MD;  Location: [facility] OR;  Service: Neurosurgery;  Laterality: N/A;    Family History  Adopted: Yes  Problem Relation Age of Onset   Hypertension Daughter      Social History:  reports that she has never smoked. She has never used smokeless tobacco. She reports that she does not drink alcohol and does not use drugs.The patient is alone today.  Allergies:  Allergies  Allergen Reactions   Ibuprofen Other (See Comments) and Nausea And Vomiting    REFLUX  Other reaction(s): GI intolerance  Other reaction(s): Other (See Comments)  Upsets reflux  Exacerbates GERD   Nsaids Other (See Comments)    REFLUX [IBUPROFEN]  "Doctor told me I couldn't take any"   Tolmetin Other (See Comments)    Other reaction(s): Other (See Comments)  REFLUX [IBUPROFEN]  Exacerbates GERD   Codeine    Cyclobenzaprine     Drug interactions with psychotropic meds   Prednisone     Per pt was told interacts with her other meds    Current Medications: Current Outpatient Medications  Medication Sig Dispense Refill   dicyclomine (BENTYL) 20 MG tablet Take 20 mg by mouth 4 (four) times daily as needed.     meclizine (ANTIVERT) 25 MG tablet Take 12.5-25 mg by mouth every 8 (eight) hours as needed.     prazosin (MINIPRESS) 1 MG capsule      albuterol  (PROAIR  HFA) 108 (90 Base) MCG/ACT inhaler inhale TWO PUFFS BY MOUTH EVERY 6 HOURS AS NEEDED FOR WHEEZING OR SHORTNESS OF BREATH     amLODipine (NORVASC) 5 MG tablet Take 5 mg by mouth every morning.     atenolol (TENORMIN) 25 MG tablet Take 25 mg by mouth every morning. (Patient not taking: Reported on 05/15/2023)     atorvastatin  (LIPITOR) 20 MG tablet Take 20 mg by mouth at bedtime.     divalproex (DEPAKOTE ER) 500 MG 24 hr tablet Take 500 mg by mouth every morning.     famotidine (PEPCID) 20 MG tablet Take 20 mg by mouth 2 (two) times daily as needed.     fluticasone (FLONASE) 50 MCG/ACT nasal spray SMARTSIG:2 Spray(s) Both Nares Every Morning     lansoprazole (PREVACID) 30 MG capsule Take 30 mg by mouth 2 (two) times daily.     levothyroxine (SYNTHROID) 25 MCG tablet Take 25 mcg by mouth every morning.      methocarbamol  (ROBAXIN ) 500 MG tablet Take 500 mg by mouth 2 (two) times daily as needed. (Patient not taking: Reported on 11/13/2023)     oxyCODONE -acetaminophen  (PERCOCET/ROXICET) 5-325 MG tablet Take 1 tablet by mouth every 12 (twelve) hours as needed.     potassium chloride  SA (KLOR-CON  M) 20 MEQ tablet Take 1 tablet by mouth daily.     QUEtiapine  (SEROQUEL ) 25 MG tablet SMARTSIG:1 Tablet(s) By Mouth Every Evening     sucralfate (CARAFATE) 1 GM/10ML suspension Take 1 g by mouth daily.     SUMAtriptan (IMITREX) 20 MG/ACT nasal spray Place 20 mg into the nose every 2 (two) hours as needed for headache or migraine. May repeat in 2 hours if  headache persists or recurs.     SYMBICORT 160-4.5 MCG/ACT inhaler Inhale 2 puffs into the lungs 2 (two) times daily.     venlafaxine XR (EFFEXOR-XR) 150 MG 24 hr capsule Take 150 mg by mouth daily.     venlafaxine XR (EFFEXOR-XR) 75 MG 24 hr capsule Take 75 mg by mouth daily with breakfast. Takes with 150mg      Vitamin D , Ergocalciferol , (DRISDOL ) 1.25 MG (50000 UNIT) CAPS capsule Take 50,000 Units by mouth once a week.     No current facility-administered medications for this visit.     ASSESSMENT & PLAN:   Assessment & Plan: April Pham is a 68 y.o. female   History of anemia.  Hemoglobin remains normal.  As she has labs drawn at her PCP office, we do not need to continue here. History of stage II hormone receptor positive breast cancer.  She was treated with lumpectomy, radiation, and adjuvant hormonal therapy for 7 years.  She remains without evidence of recurrence. I will plan to see her back in 1 year with bilateral screening mammogram in our long-term survivorship clinic.   The patient understands the plans discussed today and is in agreement with them.  She knows to contact our office if she develops concerns prior to her next appointment.     I provided 30 minutes of face-to-face time during this encounter and > 50% was spent  counseling as documented under my assessment and plan.    Rhian Funari A Lluvia Gwynne, PA-C  [facility] [address] [place] [address] Dept: [phone] Dept Fax: [phone]   No orders of the defined types were placed in this encounter.
